# Patient Record
Sex: Female | Born: 1935 | Race: White | Hispanic: No | Smoking: Former smoker
Health system: Southern US, Community
[De-identification: ages and names within clinical notes are randomized; demographics above are authoritative.]

## PROBLEM LIST (undated history)

## (undated) DIAGNOSIS — L719 Rosacea, unspecified: Secondary | ICD-10-CM

## (undated) DIAGNOSIS — N390 Urinary tract infection, site not specified: Secondary | ICD-10-CM

## (undated) DIAGNOSIS — R7303 Prediabetes: Secondary | ICD-10-CM

## (undated) DIAGNOSIS — I10 Essential (primary) hypertension: Secondary | ICD-10-CM

## (undated) DIAGNOSIS — E785 Hyperlipidemia, unspecified: Secondary | ICD-10-CM

## (undated) DIAGNOSIS — E669 Obesity, unspecified: Secondary | ICD-10-CM

## (undated) DIAGNOSIS — H3530 Unspecified macular degeneration: Secondary | ICD-10-CM

## (undated) DIAGNOSIS — C4490 Unspecified malignant neoplasm of skin, unspecified: Secondary | ICD-10-CM

## (undated) HISTORY — DX: Unspecified macular degeneration: H35.30

## (undated) HISTORY — PX: ABDOMINAL HYSTERECTOMY: SHX81

## (undated) HISTORY — PX: FACIAL RECONSTRUCTION SURGERY: SHX631

## (undated) HISTORY — PX: FRACTURE SURGERY: SHX138

---

## 2004-12-14 ENCOUNTER — Ambulatory Visit: Payer: Self-pay | Admitting: Family Medicine

## 2008-12-17 ENCOUNTER — Ambulatory Visit: Payer: Self-pay | Admitting: Family Medicine

## 2014-04-14 DIAGNOSIS — C4491 Basal cell carcinoma of skin, unspecified: Secondary | ICD-10-CM

## 2014-04-14 HISTORY — DX: Basal cell carcinoma of skin, unspecified: C44.91

## 2015-09-09 ENCOUNTER — Emergency Department: Payer: Medicare Other

## 2015-09-09 ENCOUNTER — Observation Stay
Admit: 2015-09-09 | Discharge: 2015-09-09 | Disposition: A | Discharge status: Home / Self Care | Payer: Medicare Other | Attending: Internal Medicine | Admitting: Internal Medicine

## 2015-09-09 ENCOUNTER — Observation Stay
Admission: EM | Admit: 2015-09-09 | Discharge: 2015-09-10 | Disposition: A | Discharge status: Home / Self Care | Payer: Medicare Other | Attending: Internal Medicine | Admitting: Internal Medicine

## 2015-09-09 DIAGNOSIS — D72820 Lymphocytosis (symptomatic): Secondary | ICD-10-CM

## 2015-09-09 DIAGNOSIS — D72829 Elevated white blood cell count, unspecified: Secondary | ICD-10-CM

## 2015-09-09 DIAGNOSIS — Z8249 Family history of ischemic heart disease and other diseases of the circulatory system: Secondary | ICD-10-CM | POA: Diagnosis not present

## 2015-09-09 DIAGNOSIS — R778 Other specified abnormalities of plasma proteins: Secondary | ICD-10-CM

## 2015-09-09 DIAGNOSIS — R000 Tachycardia, unspecified: Secondary | ICD-10-CM | POA: Insufficient documentation

## 2015-09-09 DIAGNOSIS — R0781 Pleurodynia: Secondary | ICD-10-CM

## 2015-09-09 DIAGNOSIS — R748 Abnormal levels of other serum enzymes: Secondary | ICD-10-CM | POA: Diagnosis not present

## 2015-09-09 DIAGNOSIS — Z85828 Personal history of other malignant neoplasm of skin: Secondary | ICD-10-CM | POA: Insufficient documentation

## 2015-09-09 DIAGNOSIS — R7303 Prediabetes: Secondary | ICD-10-CM | POA: Diagnosis not present

## 2015-09-09 DIAGNOSIS — I10 Essential (primary) hypertension: Secondary | ICD-10-CM | POA: Diagnosis not present

## 2015-09-09 DIAGNOSIS — R072 Precordial pain: Secondary | ICD-10-CM | POA: Diagnosis present

## 2015-09-09 DIAGNOSIS — I200 Unstable angina: Principal | ICD-10-CM | POA: Insufficient documentation

## 2015-09-09 DIAGNOSIS — I440 Atrioventricular block, first degree: Secondary | ICD-10-CM | POA: Insufficient documentation

## 2015-09-09 DIAGNOSIS — R079 Chest pain, unspecified: Secondary | ICD-10-CM | POA: Insufficient documentation

## 2015-09-09 DIAGNOSIS — Z7982 Long term (current) use of aspirin: Secondary | ICD-10-CM | POA: Insufficient documentation

## 2015-09-09 DIAGNOSIS — E785 Hyperlipidemia, unspecified: Secondary | ICD-10-CM | POA: Diagnosis not present

## 2015-09-09 DIAGNOSIS — J329 Chronic sinusitis, unspecified: Secondary | ICD-10-CM | POA: Insufficient documentation

## 2015-09-09 DIAGNOSIS — Z6833 Body mass index (BMI) 33.0-33.9, adult: Secondary | ICD-10-CM | POA: Diagnosis not present

## 2015-09-09 DIAGNOSIS — R7989 Other specified abnormal findings of blood chemistry: Secondary | ICD-10-CM | POA: Diagnosis not present

## 2015-09-09 DIAGNOSIS — R071 Chest pain on breathing: Secondary | ICD-10-CM | POA: Diagnosis not present

## 2015-09-09 DIAGNOSIS — R61 Generalized hyperhidrosis: Secondary | ICD-10-CM | POA: Diagnosis not present

## 2015-09-09 DIAGNOSIS — N289 Disorder of kidney and ureter, unspecified: Secondary | ICD-10-CM | POA: Diagnosis not present

## 2015-09-09 DIAGNOSIS — Z79899 Other long term (current) drug therapy: Secondary | ICD-10-CM | POA: Diagnosis not present

## 2015-09-09 DIAGNOSIS — C9110 Chronic lymphocytic leukemia of B-cell type not having achieved remission: Secondary | ICD-10-CM

## 2015-09-09 HISTORY — DX: Prediabetes: R73.03

## 2015-09-09 HISTORY — DX: Rosacea, unspecified: L71.9

## 2015-09-09 HISTORY — DX: Essential (primary) hypertension: I10

## 2015-09-09 HISTORY — DX: Hyperlipidemia, unspecified: E78.5

## 2015-09-09 HISTORY — DX: Unspecified malignant neoplasm of skin, unspecified: C44.90

## 2015-09-09 HISTORY — DX: Obesity, unspecified: E66.9

## 2015-09-09 LAB — BASIC METABOLIC PANEL
ANION GAP: 9 (ref 5–15)
BUN: 22 mg/dL — ABNORMAL HIGH (ref 6–20)
CALCIUM: 9.3 mg/dL (ref 8.9–10.3)
CO2: 24 mmol/L (ref 22–32)
CREATININE: 1.14 mg/dL — AB (ref 0.44–1.00)
Chloride: 105 mmol/L (ref 101–111)
GFR calc Af Amer: 51 mL/min — ABNORMAL LOW (ref >60)
GFR calc non Af Amer: 44 mL/min — ABNORMAL LOW (ref >60)
Glucose, Bld: 127 mg/dL — ABNORMAL HIGH (ref 65–99)
Potassium: 3.9 mmol/L (ref 3.5–5.1)
SODIUM: 138 mmol/L (ref 135–145)

## 2015-09-09 LAB — CBC
HCT: 42.2 % (ref 35.0–47.0)
HEMOGLOBIN: 14 g/dL (ref 12.0–16.0)
MCH: 30.1 pg (ref 26.0–34.0)
MCHC: 33.2 g/dL (ref 32.0–36.0)
MCV: 90.6 fL (ref 80.0–100.0)
PLATELETS: 244 10*3/uL (ref 150–440)
RBC: 4.65 MIL/uL (ref 3.80–5.20)
RDW: 13.1 % (ref 11.5–14.5)
WBC: 14 10*3/uL — AB (ref 3.6–11.0)

## 2015-09-09 LAB — TROPONIN I
TROPONIN I: 0.04 ng/mL — AB (ref <0.031)
Troponin I: 0.04 ng/mL — ABNORMAL HIGH (ref <0.031)
Troponin I: 0.04 ng/mL — ABNORMAL HIGH (ref <0.031)

## 2015-09-09 MED ORDER — LOSARTAN POTASSIUM 50 MG PO TABS
100.0000 mg | ORAL_TABLET | Freq: Every day | ORAL | Status: DC
  Administered 2015-09-10: 100 mg via ORAL
  Filled 2015-09-09: qty 2

## 2015-09-09 MED ORDER — METOPROLOL TARTRATE 25 MG PO TABS
12.5000 mg | ORAL_TABLET | Freq: Two times a day (BID) | ORAL | Status: DC
  Administered 2015-09-09 (×2): 12.5 mg via ORAL
  Filled 2015-09-09 (×2): qty 1

## 2015-09-09 MED ORDER — ACETAMINOPHEN 325 MG PO TABS: 650.0000 mg | ORAL_TABLET | ORAL | Status: DC | PRN

## 2015-09-09 MED ORDER — ASPIRIN 81 MG PO CHEW
CHEWABLE_TABLET | ORAL | Status: AC
  Administered 2015-09-09: 324 mg via ORAL
  Filled 2015-09-09: qty 4

## 2015-09-09 MED ORDER — ONDANSETRON HCL 4 MG/2ML IJ SOLN: 4.0000 mg | Freq: Four times a day (QID) | INTRAMUSCULAR | Status: DC | PRN

## 2015-09-09 MED ORDER — SODIUM CHLORIDE 0.9 % IV BOLUS (SEPSIS)
1000.0000 mL | Freq: Once | INTRAVENOUS | Status: AC
  Administered 2015-09-09: 1000 mL via INTRAVENOUS

## 2015-09-09 MED ORDER — HYDROCHLOROTHIAZIDE 12.5 MG PO CAPS
12.5000 mg | ORAL_CAPSULE | Freq: Every day | ORAL | Status: DC
  Administered 2015-09-10: 12.5 mg via ORAL
  Filled 2015-09-09: qty 1

## 2015-09-09 MED ORDER — ASPIRIN 81 MG PO CHEW
324.0000 mg | CHEWABLE_TABLET | Freq: Once | ORAL | Status: AC
  Administered 2015-09-09: 324 mg via ORAL

## 2015-09-09 MED ORDER — MORPHINE SULFATE (PF) 2 MG/ML IV SOLN: 2.0000 mg | INTRAVENOUS | Status: DC | PRN

## 2015-09-09 MED ORDER — ATORVASTATIN CALCIUM 20 MG PO TABS: 20.0000 mg | ORAL_TABLET | Freq: Every day | ORAL | Status: DC

## 2015-09-09 MED ORDER — ZOLPIDEM TARTRATE 5 MG PO TABS
5.0000 mg | ORAL_TABLET | Freq: Every evening | ORAL | Status: DC | PRN
  Administered 2015-09-10: 5 mg via ORAL
  Filled 2015-09-09: qty 1

## 2015-09-09 MED ORDER — ENOXAPARIN SODIUM 40 MG/0.4ML ~~LOC~~ SOLN
40.0000 mg | SUBCUTANEOUS | Status: DC
  Administered 2015-09-09: 40 mg via SUBCUTANEOUS
  Filled 2015-09-09: qty 0.4

## 2015-09-09 MED ORDER — ASPIRIN EC 81 MG PO TBEC
81.0000 mg | DELAYED_RELEASE_TABLET | Freq: Every day | ORAL | Status: DC
  Administered 2015-09-10: 81 mg via ORAL
  Filled 2015-09-09: qty 1

## 2015-09-09 MED ORDER — ADULT MULTIVITAMIN W/MINERALS CH
1.0000 | ORAL_TABLET | Freq: Every day | ORAL | Status: DC
  Administered 2015-09-10: 1 via ORAL
  Filled 2015-09-09: qty 1

## 2015-09-09 MED ORDER — LOSARTAN POTASSIUM-HCTZ 100-12.5 MG PO TABS: 1.0000 | ORAL_TABLET | Freq: Every day | ORAL | Status: DC

## 2015-09-09 NOTE — H&P (Signed)
[facility] NAME: Shelly Phillips    MR#:  [id number]  DATE OF BIRTH:  1933-03-13  DATE OF ADMISSION:  09/09/2015  PRIMARY CARE PHYSICIAN: Dion Body, MD   REQUESTING/REFERRING PHYSICIAN: Joni Fears  CHIEF COMPLAINT:  Chest pain  HISTORY OF PRESENT ILLNESS:  Shelly Phillips  is a 82 y.o. female with a known history of essential hypertension and hyperlipidemia is presenting to the ED with a chief complaint of chest pain started at 10 AM when shopping at [employer]. Initially it started as chest tightness and subsequently patient started having chest pain. Patient sat down in [place] hoping that the tightness and pain will go away, but unfortunately it was not relieved and patient came into the ED. Initial troponin is negative. EKG with sinus tachycardia and nonspecific ST-T changes. Resting comfortably during my examination  PAST MEDICAL HISTORY:   Past Medical History  Diagnosis Date  . Rosacea   . Hypertension   . Skin cancer     PAST SURGICAL HISTOIRY:   Past Surgical History  Procedure Laterality Date  . Fracture surgery    . Facial reconstruction surgery      SOCIAL HISTORY:   Social History  Substance Use Topics  . Smoking status: Never Smoker   . Smokeless tobacco: Not on file  . Alcohol Use: No    FAMILY HISTORY:  Cardiac history-dad  DRUG ALLERGIES:   Allergies  Allergen Reactions  . Demerol [Meperidine] Nausea And Vomiting    REVIEW OF SYSTEMS:  CONSTITUTIONAL: No fever, fatigue or weakness.  EYES: No blurred or double vision.  EARS, NOSE, AND THROAT: No tinnitus or ear pain.  RESPIRATORY: No cough, shortness of breath, wheezing or hemoptysis. Has chronic sinusitis CARDIOVASCULAR: No chest pain, orthopnea, edema.  GASTROINTESTINAL: No nausea, vomiting, diarrhea or abdominal pain.  GENITOURINARY: No dysuria, hematuria.  ENDOCRINE: No polyuria, nocturia,  HEMATOLOGY: No anemia, easy  bruising or bleeding SKIN: No rash or lesion. MUSCULOSKELETAL: No joint pain or arthritis.   NEUROLOGIC: No tingling, numbness, weakness.  PSYCHIATRY: No anxiety or depression.   MEDICATIONS AT HOME:   Prior to Admission medications   Medication Sig Start Date End Date Taking? Authorizing Provider  aspirin EC 81 MG tablet Take 1 tablet by mouth daily. 02/27/15  Yes Historical Provider, MD  atorvastatin (LIPITOR) 20 MG tablet Take 20 mg by mouth daily at 6 PM.   Yes Historical Provider, MD  losartan-hydrochlorothiazide (HYZAAR) 100-12.5 MG tablet Take 1 tablet by mouth daily.   Yes Historical Provider, MD  Multiple Vitamin (MULTI-VITAMINS) TABS Take 1 tablet by mouth daily.   Yes Historical Provider, MD      VITAL SIGNS:  Blood pressure 133/80, pulse 113, temperature 98.2 F (36.8 C), temperature source Oral, resp. rate 18, height 5\' 8"  (1.727 m), weight 99.791 kg (220 lb), SpO2 98 %.  PHYSICAL EXAMINATION:  GENERAL:  82 y.o.-year-old patient lying in the bed with no acute distress.  EYES: Pupils equal, round, reactive to light and accommodation. No scleral icterus. Extraocular muscles intact.  HEENT: Head atraumatic, normocephalic. Oropharynx and nasopharynx clear.  NECK:  Supple, no jugular venous distention. No thyroid enlargement, no tenderness.  LUNGS: Normal breath sounds bilaterally, no wheezing, rales,rhonchi or crepitation. No use of accessory muscles of respiration.  CARDIOVASCULAR: S1, S2 normal. No murmurs, rubs, or gallops. No reproducible anterior chest wall tenderness ABDOMEN: Soft, nontender, nondistended. Bowel sounds present. No organomegaly or mass.  EXTREMITIES: No pedal edema, cyanosis, or  clubbing.  NEUROLOGIC: Cranial nerves II through XII are intact. Muscle strength 5/5 in all extremities. Sensation intact. Gait not checked.  PSYCHIATRIC: The patient is alert and oriented x 3.  SKIN: No obvious rash, lesion, or ulcer.   LABORATORY PANEL:   CBC  Recent  Labs Lab 09/09/15 1237  WBC 14.0*  HGB 14.0  HCT 42.2  PLT 244   ------------------------------------------------------------------------------------------------------------------  Chemistries   Recent Labs Lab 09/09/15 1237  NA 138  K 3.9  CL 105  CO2 24  GLUCOSE 127*  BUN 22*  CREATININE 1.14*  CALCIUM 9.3   ------------------------------------------------------------------------------------------------------------------  Cardiac Enzymes  Recent Labs Lab 09/09/15 1237  TROPONINI 0.04*   ------------------------------------------------------------------------------------------------------------------  RADIOLOGY:  Dg Chest 2 View  09/09/2015  CLINICAL DATA:  Chest pain and tightness for several hours, initial encounter EXAM: CHEST  2 VIEW COMPARISON:  None. FINDINGS: The heart size and mediastinal contours are within normal limits. Both lungs are clear. The visualized skeletal structures are unremarkable. IMPRESSION: No active cardiopulmonary disease. Electronically Signed   By: Inez Catalina M.D.   On: 09/09/2015 13:43    EKG:   Orders placed or performed during the hospital encounter of 09/09/15  . EKG 12-Lead  . EKG 12-Lead  . ED EKG within 10 minutes  . ED EKG within 10 minutes    IMPRESSION AND PLAN:   82 year old female with past medical history of essential hypertension and hyperlipidemia is presenting to the ED with a chief complaint of chest pain from 10 AM radiating to the left shoulder and left hand. Initial troponin is negative  #Chest pain rule out acute MI At high risk given her age, essential hypertension, hyperlipidemia and family history of cardiac disease Admit to telemetry and cycle cardiac biomarkers to rule out acute MI Ordered cardiac stress test in a.m. if troponins are negative Up to an echocardiogram Provide oxygen via nasal cannula as needed to maintain her pulse ox greater than 93% Continue home medication aspirin, statin and  Cozaar Small beta blocker to the regimen Check fasting lipid panel in a.m.  #Essential hypertension Resume her home medication Cozaar Small dose beta blocker is added as tolerated  #Hyperlipidemia unspecified Continue statin and check fasting lipid panel in a.m.  #Chronic sinusitis Currently no acute exacerbation, monitor clinically  DVT prophylaxis with Lovenox subcutaneous   All the records are reviewed and case discussed with ED provider. Management plans discussed with the patient, and she is in agreement  CODE STATUS: fc, friend Georgiann Mohs is HCPOA  TOTAL TIME TAKING CARE OF THIS PATIENT: 45  minutes.    Nicholes Mango M.D on 09/09/2015 at 3:18 PM  Between 7am to 6pm - Pager - [phone]  After 6pm go to [email or web address] - password [email or web address] [facility]  Office  [phone]  CC: Primary care physician; Dion Body, MD

## 2015-09-09 NOTE — ED Notes (Signed)
Pt c/o intermittent chest pain today while out shopping,, states she had SOB and diaphoresis with it.. Denies any pain at present.. Pt is in NAD on arrival

## 2015-09-09 NOTE — Progress Notes (Signed)
Pt admitted to 2A [id number] from ED. A&O, VSS, no complaints of pain, SR with a 1st degree HB on tele. Tele verified with Candace, NT and skin verified with Junious Dresser, RN. Admission completed, pt oriented to room and pt resting comfortably in room.

## 2015-09-09 NOTE — ED Provider Notes (Signed)
[facility] Emergency Department Provider Note  ____________________________________________  Time seen: 2:00 PM  I have reviewed the triage vital signs and the nursing notes.   HISTORY  Chief Complaint Chest Pain    HPI Shelly Phillips is a 82 y.o. female who complains of intermittent chest pain today.  The pain started at 10:00 AM while she was shopping. It was rapid in onset, anterior chest, tightness radiating to the shoulder blades, associated with shortness of breath and diaphoresis. Not exertional, seemed to improve with rest. He had a total of 2 episodes each lasting 30-45 minutes at a time. No other aggravating or alleviating factors. Has not had pain like this before. On arrival to the ED, the pain has resolved and is now 0 out of 10.  History of hypertension and hyperlipidemia.     Past Medical History  Diagnosis Date  . Rosacea   . Hypertension   . Skin cancer      There are no active problems to display for this patient.    Past Surgical History  Procedure Laterality Date  . Fracture surgery    . Facial reconstruction surgery       No current outpatient prescriptions on file.   Allergies Demerol   No family history on file.  Social History Social History  Substance Use Topics  . Smoking status: Never Smoker   . Smokeless tobacco: None  . Alcohol Use: No    Review of Systems  Constitutional:   No fever or chills. No weight changes Eyes:   No blurry vision or double vision.  ENT:   No sore throat.  Cardiovascular:   Positive as above chest pain. Respiratory:   Shortness of breath with the chest pain Gastrointestinal:   Negative for abdominal pain, vomiting and diarrhea.  No BRBPR or melena. Genitourinary:   Negative for dysuria or difficulty urinating. Musculoskeletal:   Negative for back pain. No joint swelling or pain. Skin:   Negative for rash. Neurological:   Negative for headaches, focal weakness or  numbness. Psychiatric:  No anxiety or depression.   Endocrine:  No changes in energy or sleep difficulty.  10-point ROS otherwise negative.  ____________________________________________   PHYSICAL EXAM:  VITAL SIGNS: ED Triage Vitals  Enc Vitals Group     BP 09/09/15 1233 133/80 mmHg     Pulse Rate 09/09/15 1233 113     Resp 09/09/15 1233 18     Temp 09/09/15 1233 98.2 F (36.8 C)     Temp Source 09/09/15 1233 Oral     SpO2 09/09/15 1233 98 %     Weight 09/09/15 1233 220 lb (99.791 kg)     Height 09/09/15 1233 5\' 8"  (1.727 m)     Head Cir --      Peak Flow --      Pain Score --      Pain Loc --      Pain Edu? --      Excl. in [place]? --     Vital signs reviewed, nursing assessments reviewed.   Constitutional:   Alert and oriented. Well appearing and in no distress. Eyes:   No scleral icterus. No conjunctival pallor. PERRL. EOMI ENT   Head:   Normocephalic and atraumatic.   Nose:   No congestion/rhinnorhea. No septal hematoma   Mouth/Throat:   MMM, no pharyngeal erythema. No peritonsillar mass.    Neck:   No stridor. No SubQ emphysema. No meningismus. Hematological/Lymphatic/Immunilogical:   No cervical  lymphadenopathy. Cardiovascular:   Tachycardia heart rate 110. Symmetric bilateral radial and DP pulses.  No murmurs.  Respiratory:   Normal respiratory effort without tachypnea nor retractions. Breath sounds are clear and equal bilaterally. No wheezes/rales/rhonchi. Gastrointestinal:   Soft and nontender. Non distended. There is no CVA tenderness.  No rebound, rigidity, or guarding. Genitourinary:   deferred Musculoskeletal:   Nontender with normal range of motion in all extremities. No joint effusions.  No lower extremity tenderness.  No edema. Chest wall nontender Neurologic:   Normal speech and language.  CN 2-10 normal. Motor grossly intact. No gross focal neurologic deficits are appreciated.  Skin:    Skin is warm, dry and intact. No rash noted.  No  petechiae, purpura, or bullae. Psychiatric:   Mood and affect are normal. ____________________________________________    LABS (pertinent positives/negatives) (all labs ordered are listed, but only abnormal results are displayed) Labs Reviewed  BASIC METABOLIC PANEL - Abnormal; Notable for the following:    Glucose, Bld 127 (*)    BUN 22 (*)    Creatinine, Ser 1.14 (*)    GFR calc non Af Amer 44 (*)    GFR calc Af Amer 51 (*)    All other components within normal limits  CBC - Abnormal; Notable for the following:    WBC 14.0 (*)    All other components within normal limits  TROPONIN I - Abnormal; Notable for the following:    Troponin I 0.04 (*)    All other components within normal limits   ____________________________________________   EKG  Interpreted by me Sinus tachycardia rate 114, left axis, normal intervals. Poor R-wave progression in anterior precordial leads. Normal ST segments and T waves.  ____________________________________________    RADIOLOGY  Chest x-ray unremarkable  ____________________________________________   PROCEDURES   ____________________________________________   INITIAL IMPRESSION / ASSESSMENT AND PLAN / ED COURSE  Pertinent labs & imaging results that were available during my care of the patient were reviewed by me and considered in my medical decision making (see chart for details).  Patient presents with chest pain concerning for myocardial ischemia. She has no pulse deficits, not significantly hypertensive, and pain has been intermittent and now resolved, at low suspicion for dissection or aortic aneurysm. Low suspicion for PE. No EKG changes, chest pain resolved at present time so we'll give aspirin and hold off on anticoagulation, but I think the patient does warrant inpatient monitoring for serial troponins and cardiology evaluation.  IV fluids for tachycardia. We'll discuss with  hospitalist     ____________________________________________   FINAL CLINICAL IMPRESSION(S) / ED DIAGNOSES  Final diagnoses:  Tachycardia  Precordial pain      Carrie Mew, MD 09/09/15 1421

## 2015-09-09 NOTE — ED Notes (Signed)
MD at bedside. 

## 2015-09-10 ENCOUNTER — Encounter: Payer: Self-pay | Admitting: Physician Assistant

## 2015-09-10 ENCOUNTER — Observation Stay (HOSPITAL_BASED_OUTPATIENT_CLINIC_OR_DEPARTMENT_OTHER): Payer: Medicare Other

## 2015-09-10 DIAGNOSIS — R7989 Other specified abnormal findings of blood chemistry: Secondary | ICD-10-CM

## 2015-09-10 DIAGNOSIS — R079 Chest pain, unspecified: Secondary | ICD-10-CM

## 2015-09-10 DIAGNOSIS — C9110 Chronic lymphocytic leukemia of B-cell type not having achieved remission: Secondary | ICD-10-CM

## 2015-09-10 DIAGNOSIS — D72829 Elevated white blood cell count, unspecified: Secondary | ICD-10-CM

## 2015-09-10 DIAGNOSIS — I200 Unstable angina: Secondary | ICD-10-CM

## 2015-09-10 DIAGNOSIS — N289 Disorder of kidney and ureter, unspecified: Secondary | ICD-10-CM

## 2015-09-10 DIAGNOSIS — R7303 Prediabetes: Secondary | ICD-10-CM

## 2015-09-10 DIAGNOSIS — I10 Essential (primary) hypertension: Secondary | ICD-10-CM

## 2015-09-10 DIAGNOSIS — R0781 Pleurodynia: Secondary | ICD-10-CM

## 2015-09-10 DIAGNOSIS — D72820 Lymphocytosis (symptomatic): Secondary | ICD-10-CM

## 2015-09-10 DIAGNOSIS — E785 Hyperlipidemia, unspecified: Secondary | ICD-10-CM

## 2015-09-10 DIAGNOSIS — R778 Other specified abnormalities of plasma proteins: Secondary | ICD-10-CM

## 2015-09-10 LAB — NM MYOCAR MULTI W/SPECT W/WALL MOTION / EF
CHL CUP NUCLEAR SDS: 1
CHL CUP NUCLEAR SSS: 1
CHL CUP STRESS STAGE 1 HR: 78 {beats}/min
CHL CUP STRESS STAGE 2 GRADE: 0 %
CHL CUP STRESS STAGE 2 HR: 81 {beats}/min
CHL CUP STRESS STAGE 2 SPEED: 0 mph
CHL CUP STRESS STAGE 4 SPEED: 0 mph
CHL CUP STRESS STAGE 5 DBP: 64 mmHg
CHL CUP STRESS STAGE 5 GRADE: 0 %
CHL CUP STRESS STAGE 5 HR: 93 {beats}/min
CHL CUP STRESS STAGE 5 SBP: 106 mmHg
CHL CUP STRESS STAGE 5 SPEED: 0 mph
CSEPPMHR: 68 %
Estimated workload: 1 METS
LV dias vol: 48 mL
LVSYSVOL: 12 mL
Peak HR: 96 {beats}/min
Percent HR: 70 %
Rest HR: 80 {beats}/min
SRS: 8
Stage 3 Grade: 0 %
Stage 3 HR: 81 {beats}/min
Stage 3 Speed: 0 mph
Stage 4 Grade: 0 %
Stage 4 HR: 96 {beats}/min
TID: 1

## 2015-09-10 LAB — LIPID PANEL
CHOLESTEROL: 133 mg/dL (ref 0–200)
HDL: 48 mg/dL (ref >40)
LDL CALC: 48 mg/dL (ref 0–99)
TRIGLYCERIDES: 185 mg/dL — AB (ref <150)
Total CHOL/HDL Ratio: 2.8 RATIO
VLDL: 37 mg/dL (ref 0–40)

## 2015-09-10 LAB — TROPONIN I

## 2015-09-10 MED ORDER — SODIUM CHLORIDE 0.9 % IV SOLN
INTRAVENOUS | Status: DC
  Administered 2015-09-10: 16:00:00 via INTRAVENOUS

## 2015-09-10 MED ORDER — METOPROLOL TARTRATE 25 MG PO TABS: 12.5000 mg | ORAL_TABLET | Freq: Two times a day (BID) | ORAL | Status: DC

## 2015-09-10 MED ORDER — REGADENOSON 0.4 MG/5ML IV SOLN
0.4000 mg | Freq: Once | INTRAVENOUS | Status: AC
  Administered 2015-09-10: 0.4 mg via INTRAVENOUS

## 2015-09-10 MED ORDER — TECHNETIUM TC 99M SESTAMIBI - CARDIOLITE
30.4300 | Freq: Once | INTRAVENOUS | Status: AC | PRN
  Administered 2015-09-10: 30.43 via INTRAVENOUS

## 2015-09-10 MED ORDER — TECHNETIUM TC 99M SESTAMIBI - CARDIOLITE
11.9300 | Freq: Once | INTRAVENOUS | Status: AC | PRN
  Administered 2015-09-10: 11:00:00 11.93 via INTRAVENOUS

## 2015-09-10 NOTE — Progress Notes (Signed)
Discharge instructions along with home medication list gone over with patient. Patient verbalized that she understood instructions. Patient made aware that rx was electronically submitted to pharmacy. Iv and telemetry removed. Patient to be discharged home on RA. No distress noted, friend at bedside to transport patient home

## 2015-09-10 NOTE — Progress Notes (Signed)
Patient refusing to be stuck again for ct angio, patient needs a 20 gauge. Per was made aware iv is needed to have the ct angio done. Patient stated " i dont want to do the test, i want to go home. Call the dr and have her cancel the test" called and spoke with dr. Ether Griffins to make aware patient is refusing to have test done. Per md will place orders to discharge patient. Will continue to monitor

## 2015-09-10 NOTE — Consult Note (Signed)
Cardiology Consultation Note  Patient ID: Shelly Phillips, MRN: [id number], DOB/AGE: 01/21/1933 82 y.o. Admit date: 09/09/2015   Date of Consult: 09/10/2015 Primary Physician: Dion Body, MD Primary Cardiologist: New to [facility]  Chief Complaint: Chest pain Reason for Consult: Chest pain  HPI: 82 y.o. female with h/o HTN, HLD, pre-diabetes, and obesity who presented to [facility] on 2/22 with chest pain last 2 minutes while walking through [employer].   She has no previously known cardiac history and has never seen a cardiologist or had a stress test or cardiac cath. SHe is fairly active at baseline and able to take care of all her ADL's without issues. She was never a smoker, denies using alcohol or illegal drugs. She has been in her usual state of health and asked her friend to drive her to [place] on 2/22 to pick up some supples. While she was there she suddenly got very tired and felt like she needed to sit down so she went to the shoe department. Upon sitting down she developed chest pain that lasted 2 minutes before self resolving. She was wheeled out to the car in a wheelchair. While being wheeled out she had a couple episodes of fleeting chest pain that lasted seconds and some left arm numbness. She denies any diaphoresis, nausea, vomiting, palpitations, presyncope, syncope, or dizziness. She has never had chest pain before. Since her admission she has not had any further chest pain.   Upon the patient's arrival to [facility] they were found to have troponin 0.04 x 3-->0.03, WBC 14.0, SCr 1.14, BUN 22, LDL 48, trigs 185. ECG showed sinus tachycardia, 114 bpm, inferior and anteroseptal infarct, follow up EKG on 2/23 showed NSR, 78 bpm, 1st degree AV block. CXR showed no active disease. She has been scheduled for nuclear stress test by IM. She is currently chest pain free.   Past Medical History  Diagnosis Date  . Rosacea   . Hypertension   . Skin cancer   . HLD (hyperlipidemia)   . Obesity   .  Pre-diabetes       Most Recent Cardiac Studies: none   Surgical History:  Past Surgical History  Procedure Laterality Date  . Fracture surgery    . Facial reconstruction surgery       Home Meds: Prior to Admission medications   Medication Sig Start Date End Date Taking? Authorizing Provider  aspirin EC 81 MG tablet Take 1 tablet by mouth daily. 02/27/15  Yes Historical Provider, MD  atorvastatin (LIPITOR) 20 MG tablet Take 20 mg by mouth daily at 6 PM.   Yes Historical Provider, MD  losartan-hydrochlorothiazide (HYZAAR) 100-12.5 MG tablet Take 1 tablet by mouth daily.   Yes Historical Provider, MD  Multiple Vitamin (MULTI-VITAMINS) TABS Take 1 tablet by mouth daily.   Yes Historical Provider, MD    Inpatient Medications:  . aspirin EC  81 mg Oral Daily  . atorvastatin  20 mg Oral q1800  . enoxaparin (LOVENOX) injection  40 mg Subcutaneous Q24H  . losartan  100 mg Oral Daily   And  . hydrochlorothiazide  12.5 mg Oral Daily  . metoprolol tartrate  12.5 mg Oral BID  . multivitamin with minerals  1 tablet Oral Daily      Allergies:  Allergies  Allergen Reactions  . Demerol [Meperidine] Nausea And Vomiting    Social History   Social History  . Marital Status: Widowed    Spouse Name: N/A  . Number of Children: N/A  . Years  of Education: N/A   Occupational History  . Not on file.   Social History Main Topics  . Smoking status: Never Smoker   . Smokeless tobacco: Not on file  . Alcohol Use: No  . Drug Use: Not on file  . Sexual Activity: Not on file   Other Topics Concern  . Not on file   Social History Narrative     Family History  Problem Relation Age of Onset  . CAD Father   . Cancer Brother   . Parkinson's disease Mother      Review of Systems: Review of Systems  Constitutional: Positive for malaise/fatigue. Negative for fever, chills, weight loss and diaphoresis.  HENT: Negative for congestion.   Eyes: Negative for discharge and redness.    Respiratory: Negative for cough, hemoptysis, sputum production, shortness of breath and wheezing.   Cardiovascular: Positive for chest pain. Negative for palpitations, orthopnea, claudication, leg swelling and PND.  Gastrointestinal: Negative for heartburn, nausea, vomiting, abdominal pain, blood in stool and melena.  Musculoskeletal: Negative for myalgias and falls.  Skin: Negative for rash.  Neurological: Positive for weakness. Negative for dizziness, tingling, tremors, sensory change, speech change, focal weakness and loss of consciousness.  Endo/Heme/Allergies: Does not bruise/bleed easily.  Psychiatric/Behavioral: Negative for substance abuse. The patient is not nervous/anxious.   All other systems reviewed and are negative.    Labs:  Recent Labs  09/09/15 1237 09/09/15 1822 09/09/15 2206 09/10/15 0119  TROPONINI 0.04* 0.04* 0.04* <0.03   Lab Results  Component Value Date   WBC 14.0* 09/09/2015   HGB 14.0 09/09/2015   HCT 42.2 09/09/2015   MCV 90.6 09/09/2015   PLT 244 09/09/2015     Recent Labs Lab 09/09/15 1237  NA 138  K 3.9  CL 105  CO2 24  BUN 22*  CREATININE 1.14*  CALCIUM 9.3  GLUCOSE 127*   Lab Results  Component Value Date   CHOL 133 09/10/2015   HDL 48 09/10/2015   LDLCALC 48 09/10/2015   TRIG 185* 09/10/2015   No results found for: DDIMER  Radiology/Studies:  Dg Chest 2 View  09/09/2015  CLINICAL DATA:  Chest pain and tightness for several hours, initial encounter EXAM: CHEST  2 VIEW COMPARISON:  None. FINDINGS: The heart size and mediastinal contours are within normal limits. Both lungs are clear. The visualized skeletal structures are unremarkable. IMPRESSION: No active cardiopulmonary disease. Electronically Signed   By: Inez Catalina M.D.   On: 09/09/2015 13:43    EKG: NSR, 78 bpm, 1st degree AV block, no acute st/t changes  Weights: Filed Weights   09/09/15 1233  Weight: 220 lb (99.791 kg)     Physical Exam: Blood pressure  116/71, pulse 78, temperature 98.1 F (36.7 C), temperature source Oral, resp. rate 16, height 5\' 8"  (1.727 m), weight 220 lb (99.791 kg), SpO2 96 %. Body mass index is 33.46 kg/(m^2). General: Well developed, well nourished, in no acute distress. Head: Normocephalic, atraumatic, sclera non-icteric, no xanthomas, nares are without discharge.  Neck: Negative for carotid bruits. JVD not elevated. Lungs: Clear bilaterally to auscultation without wheezes, rales, or rhonchi. Breathing is unlabored. Heart: RRR with S1 S2. No murmurs, rubs, or gallops appreciated. Abdomen: Soft, non-tender, non-distended with normoactive bowel sounds. No hepatomegaly. No rebound/guarding. No obvious abdominal masses. Msk:  Strength and tone appear normal for age. Extremities: No clubbing or cyanosis. No edema.  Distal pedal pulses are 2+ and equal bilaterally. Neuro: Alert and oriented X 3. No facial asymmetry.  No focal deficit. Moves all extremities spontaneously. Psych:  Responds to questions appropriately with a normal affect.    Assessment and Plan:   1. Chest pain with moderate risk for CAD: -Patient had an episode of chest pain lasting 2 minutes in [employer] with associated generalized fatigue  -She has been chest pain free since -Nuclear stress test and echo have been ordered by internal medicine -Further recommendations pending results of the stress test and echo -Continue aspirin 81 mg, Lopressor 12.5 mg bid, Lipitor, and losartan  2. HTN: -Well controlled continue current medications  3. HLD: -Continue Lipitor  4. Pre-diabetes: -Per IM  Signed, Christell Faith, PA-C Pager: [phone] 09/10/2015, 10:19 AM

## 2015-09-10 NOTE — Progress Notes (Signed)
Spoke with dr. Ether Griffins to make aware the stress test has resulted. Per md she will come to the floor to assess patient since she was gone this am.

## 2015-09-10 NOTE — Discharge Summary (Signed)
[facility] NAME: Shelly Phillips    MR#:  [id number]  DATE OF BIRTH:  15-Feb-1933  DATE OF ADMISSION:  09/09/2015 ADMITTING PHYSICIAN: Nicholes Mango, MD  DATE OF DISCHARGE: No discharge date for patient encounter.  PRIMARY CARE PHYSICIAN: Dion Body, MD     ADMISSION DIAGNOSIS:  Precordial pain [R07.2] Tachycardia [R00.0]  DISCHARGE DIAGNOSIS:  Principal Problem:   Unstable angina (HCC) Active Problems:   Hyperlipidemia   Pleuritic chest pain   Elevated troponin   Essential hypertension   Morbid obesity (HCC)   Pre-diabetes   Renal insufficiency   Leukocytosis   SECONDARY DIAGNOSIS:   Past Medical History  Diagnosis Date  . Rosacea   . Hypertension   . Skin cancer   . HLD (hyperlipidemia)   . Obesity   . Pre-diabetes     .pro HOSPITAL COURSE:   Patient is 82 year old Caucasian female with past medical history is negative and history of essential hypertension, hyperlipidemia, obesity, prediabetes, who presents to the hospital with complaints of chest pain, pain lasted approximately 2 minutes associated with weakness and diaphoresis. Initial troponin  revealed mild elevation to 0.04 3. Patient's labs showed mild elevation of creatinine to 1.14 and elevation to 12. This will contact 14.0.  Chest x-ray was normal . Patient was admitted to the hospital for further evaluation and underwent stress test read by Dr. Fletcher Anon and deemed to be low risk study . While in the hospital patient was also complaining of some pleuritic chest pain . CTA of chest was ordered, however, patient refused and wanted to go home . Risks were discussed this patient. However, she flatly requested to be discharged  Discussion by problem  #1. Chest pain, likely noncardiac. Since patient's IV was unremarkable. Echocardiogram is still pending at the time of dictation. Since patient's chest pain change character and was more pleuritic chest  pain. CTA of chest was recommended. However, patient's family refused despite discussions about the risks #2. Essential hypertension, continue outpatient medications. Patient's blood pressure is relatively well controlled. #3. Hyperlipidemia. Patient's lipid panel revealed LDL of 48, triglycerides 185, patient was advised to continue low-fat, low-cholesterol diet and Lipitor. #4. Renal insufficiency, it is recommended to follow kidney function as outpatient. #5. Leukocytosis, patient has no fevers to exhibit bacterial infection, recheck as outpatient  DISCHARGE CONDITIONS:   Stable  CONSULTS OBTAINED:  Treatment Team:  Nicholes Mango, MD Minna Merritts, MD  DRUG ALLERGIES:   Allergies  Allergen Reactions  . Demerol [Meperidine] Nausea And Vomiting    DISCHARGE MEDICATIONS:   Current Discharge Medication List    START taking these medications   Details  metoprolol tartrate (LOPRESSOR) 25 MG tablet Take 0.5 tablets (12.5 mg total) by mouth 2 (two) times daily. Qty: 60 tablet, Refills: 5      CONTINUE these medications which have NOT CHANGED   Details  aspirin EC 81 MG tablet Take 1 tablet by mouth daily.    atorvastatin (LIPITOR) 20 MG tablet Take 20 mg by mouth daily at 6 PM.    losartan-hydrochlorothiazide (HYZAAR) 100-12.5 MG tablet Take 1 tablet by mouth daily.    Multiple Vitamin (MULTI-VITAMINS) TABS Take 1 tablet by mouth daily.         DISCHARGE INSTRUCTIONS:    Patient is to follow-up with primary care physician and cardiologist as outpatient  If you experience worsening of your admission symptoms, develop shortness of breath, life threatening emergency, suicidal or homicidal thoughts  you must seek medical attention immediately by calling 911 or calling your MD immediately  if symptoms less severe.  You Must read complete instructions/literature along with all the possible adverse reactions/side effects for all the Medicines you take and that have been  prescribed to you. Take any new Medicines after you have completely understood and accept all the possible adverse reactions/side effects.   Please note  You were cared for by a hospitalist during your hospital stay. If you have any questions about your discharge medications or the care you received while you were in the hospital after you are discharged, you can call the unit and asked to speak with the hospitalist on call if the hospitalist that took care of you is not available. Once you are discharged, your primary care physician will handle any further medical issues. Please note that NO REFILLS for any discharge medications will be authorized once you are discharged, as it is imperative that you return to your primary care physician (or establish a relationship with a primary care physician if you do not have one) for your aftercare needs so that they can reassess your need for medications and monitor your lab values.    Today   CHIEF COMPLAINT:   Chief Complaint  Patient presents with  . Chest Pain    HISTORY OF PRESENT ILLNESS:  Shelly Phillips  is a 82 y.o. female with a known history of essential hypertension, hyperlipidemia, obesity, prediabetes, who presents to the hospital with complaints of chest pain, pain lasted approximately 2 minutes associated with weakness and diaphoresis. Initial troponin  revealed mild elevation to 0.04 3. Patient's labs showed mild elevation of creatinine to 1.14 and elevation to 12. This will contact 14.0.  Chest x-ray was normal . Patient was admitted to the hospital for further evaluation and underwent stress test read by Dr. Fletcher Anon and deemed to be low risk study . While in the hospital patient was also complaining of some pleuritic chest pain . CTA of chest was ordered, however, patient refused and wanted to go home . Risks were discussed this patient. However, she flatly requested to be discharged  Discussion by problem  #1. Chest pain, likely  noncardiac. Since patient's IV was unremarkable. Echocardiogram is still pending at the time of dictation. Since patient's chest pain change character and was more pleuritic chest pain. CTA of chest was recommended. However, patient's family refused despite discussions about the risks #2. Essential hypertension, continue outpatient medications. Patient's blood pressure is relatively well controlled. #3. Hyperlipidemia. Patient's lipid panel revealed LDL of 48, triglycerides 185, patient was advised to continue low-fat, low-cholesterol diet and Lipitor. #4. Renal insufficiency, it is recommended to follow kidney function as outpatient. #5. Leukocytosis, patient has no fevers to exhibit bacterial infection, recheck as outpatient     VITAL SIGNS:  Blood pressure 126/66, pulse 75, temperature 97.8 F (36.6 C), temperature source Oral, resp. rate 17, height 5\' 8"  (1.727 m), weight 99.791 kg (220 lb), SpO2 93 %.  I/O:   Intake/Output Summary (Last 24 hours) at 09/10/15 1632 Last data filed at 09/10/15 1415  Gross per 24 hour  Intake      0 ml  Output   1600 ml  Net  -1600 ml    PHYSICAL EXAMINATION:  GENERAL:  82 y.o.-year-old patient lying in the bed with no acute distress.  EYES: Pupils equal, round, reactive to light and accommodation. No scleral icterus. Extraocular muscles intact.  HEENT: Head atraumatic, normocephalic. Oropharynx and nasopharynx clear.  NECK:  Supple, no jugular venous distention. No thyroid enlargement, no tenderness.  LUNGS: Normal breath sounds bilaterally, no wheezing, rales,rhonchi or crepitation. No use of accessory muscles of respiration.  CARDIOVASCULAR: S1, S2 normal. No murmurs, rubs, or gallops.  ABDOMEN: Soft, non-tender, non-distended. Bowel sounds present. No organomegaly or mass.  EXTREMITIES: No pedal edema, cyanosis, or clubbing.  NEUROLOGIC: Cranial nerves II through XII are intact. Muscle strength 5/5 in all extremities. Sensation intact. Gait not  checked.  PSYCHIATRIC: The patient is alert and oriented x 3.  SKIN: No obvious rash, lesion, or ulcer.   DATA REVIEW:   CBC  Recent Labs Lab 09/09/15 1237  WBC 14.0*  HGB 14.0  HCT 42.2  PLT 244    Chemistries   Recent Labs Lab 09/09/15 1237  NA 138  K 3.9  CL 105  CO2 24  GLUCOSE 127*  BUN 22*  CREATININE 1.14*  CALCIUM 9.3    Cardiac Enzymes  Recent Labs Lab 09/10/15 0119  TROPONINI <0.03    Microbiology Results  No results found for this or any previous visit.  RADIOLOGY:  Dg Chest 2 View  09/09/2015  CLINICAL DATA:  Chest pain and tightness for several hours, initial encounter EXAM: CHEST  2 VIEW COMPARISON:  None. FINDINGS: The heart size and mediastinal contours are within normal limits. Both lungs are clear. The visualized skeletal structures are unremarkable. IMPRESSION: No active cardiopulmonary disease. Electronically Signed   By: Inez Catalina M.D.   On: 09/09/2015 13:43   Nm Myocar Multi W/spect W/wall Motion / Ef  09/10/2015   There was no ST segment deviation noted during stress.  No T wave inversion was noted during stress.  The study is normal.  This is a low risk study.  The left ventricular ejection fraction is normal (55-65%).     EKG:   Orders placed or performed during the hospital encounter of 09/09/15  . EKG 12-Lead  . EKG 12-Lead  . ED EKG within 10 minutes  . ED EKG within 10 minutes  . EKG 12-Lead (at 6am)  . EKG 12-Lead (at 6am)      Management plans discussed with the patient, family and they are in agreement.  CODE STATUS:     Code Status Orders        Start     Ordered   09/09/15 1808  Full code   Continuous     09/09/15 1807    Code Status History    Date Active Date Inactive Code Status Order ID Comments User Context   This patient has a current code status but no historical code status.      TOTAL TIME TAKING CARE OF THIS PATIENT: 40  minutes.    Theodoro Grist M.D on 09/10/2015 at 4:32  PM  Between 7am to 6pm - Pager - [phone]  After 6pm go to [email or web address] - password [email or web address] [facility]  Office  [phone]  CC: Primary care physician; Dion Body, MD

## 2015-09-11 ENCOUNTER — Telehealth: Payer: Self-pay

## 2015-09-11 NOTE — Telephone Encounter (Signed)
Patient contacted regarding discharge from [facility] on Feb 23.  Patient understands to follow up with provider Fletcher Anon on Feb 27 at [facility]. Patient understands discharge instructions? yes Patient understands medications and regiment? yes Patient understands to bring all medications to this visit? yes

## 2015-09-14 ENCOUNTER — Other Ambulatory Visit
Admission: RE | Admit: 2015-09-14 | Discharge: 2015-09-14 | Disposition: A | Discharge status: Home / Self Care | Payer: Medicare Other | Source: Ambulatory Visit | Attending: Cardiovascular Disease | Admitting: Cardiovascular Disease

## 2015-09-14 ENCOUNTER — Ambulatory Visit (INDEPENDENT_AMBULATORY_CARE_PROVIDER_SITE_OTHER): Payer: Medicare Other | Admitting: Cardiovascular Disease

## 2015-09-14 ENCOUNTER — Encounter: Payer: Self-pay | Admitting: Cardiovascular Disease

## 2015-09-14 ENCOUNTER — Other Ambulatory Visit: Payer: Self-pay

## 2015-09-14 VITALS — BP 110/80 | HR 80 | Ht 68.5 in | Wt 219.5 lb

## 2015-09-14 DIAGNOSIS — R079 Chest pain, unspecified: Secondary | ICD-10-CM

## 2015-09-14 DIAGNOSIS — I10 Essential (primary) hypertension: Secondary | ICD-10-CM

## 2015-09-14 DIAGNOSIS — R7989 Other specified abnormal findings of blood chemistry: Secondary | ICD-10-CM

## 2015-09-14 LAB — BASIC METABOLIC PANEL
ANION GAP: 10 (ref 5–15)
BUN: 20 mg/dL (ref 6–20)
CALCIUM: 9.6 mg/dL (ref 8.9–10.3)
CO2: 23 mmol/L (ref 22–32)
CREATININE: 1.06 mg/dL — AB (ref 0.44–1.00)
Chloride: 105 mmol/L (ref 101–111)
GFR, EST AFRICAN AMERICAN: 56 mL/min — AB (ref >60)
GFR, EST NON AFRICAN AMERICAN: 48 mL/min — AB (ref >60)
Glucose, Bld: 138 mg/dL — ABNORMAL HIGH (ref 65–99)
Potassium: 4.2 mmol/L (ref 3.5–5.1)
SODIUM: 138 mmol/L (ref 135–145)

## 2015-09-14 LAB — FIBRIN DERIVATIVES D-DIMER (ARMC ONLY): Fibrin derivatives D-dimer (ARMC): 609 — ABNORMAL HIGH (ref 0–499)

## 2015-09-14 NOTE — Progress Notes (Signed)
Cardiology Office Note   Date:  09/14/2015   ID:  AQUEELAH BUREAU, DOB 1933-01-08, MRN [id number]  PCP:  Dion Body, MD  Cardiologist:   Kathlyn Sacramento, MD   Chief Complaint  Patient presents with  . other    Follow up from [facility]; chest pain. Meds reviewed by the patient verbally. "doing well."       History of Present Illness: Shelly Phillips is a 82 y.o. female who presents for a follow-up visit after recent hospitalization for chest pain. She has no previous cardiac history. She has known history of hypertension, hyperlipidemia and. Diabetes. She presented recently to [facility] with chest pain while she was shopping at [employer]. While she was there she  Suddenly had an episode of weakness which required her to sit down. She then developed substernal chest pain radiating to her back which was described as sharp discomfort. The pain was worse with taking a breath. Upon arrival to the emergency room, she was noted to have mild sinus tachycardia with a heart rate of 114 bpm she was anxious. Troponin was borderline elevated at 0.04 and then decreased. She underwent a pharmacologic nuclear stress test which showed no evidence of ischemia with normal ejection fraction.echocardiogram was also unremarkable. Evaluation for pulmonary embolism was recommended but the patient requested to be discharged home. She reports no further episodes since her hospital discharge and she is back to her normal self. She has no previous history of DVT.    Past Medical History  Diagnosis Date  . Rosacea   . Hypertension   . Skin cancer   . HLD (hyperlipidemia)   . Obesity   . Pre-diabetes     Past Surgical History  Procedure Laterality Date  . Fracture surgery    . Facial reconstruction surgery       Current Outpatient Prescriptions  Medication Sig Dispense Refill  . aspirin EC 81 MG tablet Take 1 tablet by mouth daily.    Marland Kitchen atorvastatin (LIPITOR) 20 MG tablet Take 20 mg by mouth daily at 6  PM.    . losartan-hydrochlorothiazide (HYZAAR) 100-12.5 MG tablet Take 1 tablet by mouth daily.    . metoprolol tartrate (LOPRESSOR) 25 MG tablet Take 0.5 tablets (12.5 mg total) by mouth 2 (two) times daily. 60 tablet 5  . Multiple Vitamin (MULTI-VITAMINS) TABS Take 1 tablet by mouth daily.     No current facility-administered medications for this visit.    Allergies:   Demerol    Social History:  The patient  reports that she has never smoked. She does not have any smokeless tobacco history on file. She reports that she does not drink alcohol or use illicit drugs.   Family History:  The patient's family history includes CAD in her father; Cancer in her brother; Parkinson's disease in her mother.       PHYSICAL EXAM: VS:  BP 110/80 mmHg  Pulse 80  Ht 5' 8.5" (1.74 m)  Wt 219 lb 8 oz (99.565 kg)  BMI 32.89 kg/m2  SpO2 98% , BMI Body mass index is 32.89 kg/(m^2). GEN: Well nourished, well developed, in no acute distress HEENT: normal Neck: no JVD, carotid bruits, or masses Cardiac: RRR; no murmurs, rubs, or gallops,no edema  Respiratory:  clear to auscultation bilaterally, normal work of breathing GI: soft, nontender, nondistended, + BS MS: no deformity or atrophy Skin: warm and dry, no rash Neuro:  Strength and sensation are intact Psych: euthymic mood, full affect   EKG:  EKG is ordered today. The ekg ordered today demonstrates normal sinus rhythm with no significant ST or T wave changes.   Recent Labs: 09/09/2015: Hemoglobin 14.0; Platelets 244 09/14/2015: BUN 20; Creatinine, Ser 1.06*; Potassium 4.2; Sodium 138    Lipid Panel    Component Value Date/Time   CHOL 133 09/10/2015 0119   TRIG 185* 09/10/2015 0119   HDL 48 09/10/2015 0119   CHOLHDL 2.8 09/10/2015 0119   VLDL 37 09/10/2015 0119   LDLCALC 48 09/10/2015 0119      Wt Readings from Last 3 Encounters:  09/14/15 219 lb 8 oz (99.565 kg)  09/09/15 220 lb (99.791 kg)         ASSESSMENT AND  PLAN:  1.  Pleuritic chest pain: this has resolved completely. Cardiac workup was negative including stress test and echocardiogram. She is currently back to her baseline and has no further symptoms. Nonetheless, her symptoms were definitely concerning enough to require evaluation for pulmonary embolism especially that the chest pain was pleuritic and she had mild sinus tachycardia. I'm going to check d-dimer today and if elevated I recommend evaluation with CTA of the pulmonary arteries.  2. Essential hypertension: blood pressure is well controlled on current medications.  3.  Hyperlipidemia: she is currently on atorvastatin.    Disposition:   FU as needed based on the above.  Signed, Kathlyn Sacramento, MD  09/14/2015 4:10 PM    [facility]

## 2015-09-14 NOTE — Patient Instructions (Signed)
Medication Instructions:  Your physician recommends that you continue on your current medications as directed. Please refer to the Current Medication list given to you today.   Labwork: D-Dimer and BMET today   Testing/Procedures: None at this time  Follow-Up: Your physician recommends that you schedule a follow-up appointment as needed.    Any Other Special Instructions Will Be Listed Below (If Applicable).     If you need a refill on your cardiac medications before your next appointment, please call your pharmacy.

## 2015-09-15 ENCOUNTER — Ambulatory Visit
Admission: RE | Admit: 2015-09-15 | Discharge: 2015-09-15 | Disposition: A | Discharge status: Home / Self Care | Payer: Medicare Other | Source: Ambulatory Visit | Attending: Cardiovascular Disease | Admitting: Cardiovascular Disease

## 2015-09-15 DIAGNOSIS — R918 Other nonspecific abnormal finding of lung field: Secondary | ICD-10-CM | POA: Insufficient documentation

## 2015-09-15 DIAGNOSIS — I2510 Atherosclerotic heart disease of native coronary artery without angina pectoris: Secondary | ICD-10-CM | POA: Diagnosis not present

## 2015-09-15 DIAGNOSIS — R791 Abnormal coagulation profile: Secondary | ICD-10-CM | POA: Insufficient documentation

## 2015-09-15 DIAGNOSIS — R7989 Other specified abnormal findings of blood chemistry: Secondary | ICD-10-CM

## 2015-09-15 MED ORDER — IOHEXOL 350 MG/ML SOLN
80.0000 mL | Freq: Once | INTRAVENOUS | Status: AC | PRN
  Administered 2015-09-15: 80 mL via INTRAVENOUS

## 2016-03-15 ENCOUNTER — Inpatient Hospital Stay
Admission: EM | Admit: 2016-03-15 | Discharge: 2016-03-17 | DRG: 683 | Disposition: A | Discharge status: Home / Self Care | Payer: Medicare Other | Attending: Internal Medicine | Admitting: Internal Medicine

## 2016-03-15 ENCOUNTER — Emergency Department: Payer: Medicare Other

## 2016-03-15 ENCOUNTER — Encounter: Payer: Self-pay | Admitting: Emergency Medicine

## 2016-03-15 DIAGNOSIS — R7989 Other specified abnormal findings of blood chemistry: Secondary | ICD-10-CM | POA: Diagnosis present

## 2016-03-15 DIAGNOSIS — E785 Hyperlipidemia, unspecified: Secondary | ICD-10-CM | POA: Diagnosis present

## 2016-03-15 DIAGNOSIS — R0602 Shortness of breath: Secondary | ICD-10-CM

## 2016-03-15 DIAGNOSIS — Z7982 Long term (current) use of aspirin: Secondary | ICD-10-CM | POA: Diagnosis not present

## 2016-03-15 DIAGNOSIS — N390 Urinary tract infection, site not specified: Secondary | ICD-10-CM

## 2016-03-15 DIAGNOSIS — N289 Disorder of kidney and ureter, unspecified: Secondary | ICD-10-CM

## 2016-03-15 DIAGNOSIS — Z66 Do not resuscitate: Secondary | ICD-10-CM | POA: Diagnosis present

## 2016-03-15 DIAGNOSIS — Z820 Family history of epilepsy and other diseases of the nervous system: Secondary | ICD-10-CM

## 2016-03-15 DIAGNOSIS — Z809 Family history of malignant neoplasm, unspecified: Secondary | ICD-10-CM | POA: Diagnosis not present

## 2016-03-15 DIAGNOSIS — N179 Acute kidney failure, unspecified: Principal | ICD-10-CM | POA: Diagnosis present

## 2016-03-15 DIAGNOSIS — Z8249 Family history of ischemic heart disease and other diseases of the circulatory system: Secondary | ICD-10-CM | POA: Diagnosis not present

## 2016-03-15 DIAGNOSIS — Z85828 Personal history of other malignant neoplasm of skin: Secondary | ICD-10-CM | POA: Diagnosis not present

## 2016-03-15 DIAGNOSIS — E669 Obesity, unspecified: Secondary | ICD-10-CM | POA: Diagnosis present

## 2016-03-15 DIAGNOSIS — Z79899 Other long term (current) drug therapy: Secondary | ICD-10-CM | POA: Diagnosis not present

## 2016-03-15 DIAGNOSIS — Z888 Allergy status to other drugs, medicaments and biological substances status: Secondary | ICD-10-CM | POA: Diagnosis not present

## 2016-03-15 DIAGNOSIS — E860 Dehydration: Secondary | ICD-10-CM | POA: Diagnosis present

## 2016-03-15 DIAGNOSIS — I10 Essential (primary) hypertension: Secondary | ICD-10-CM | POA: Diagnosis present

## 2016-03-15 LAB — LACTIC ACID, PLASMA
LACTIC ACID, VENOUS: 1.6 mmol/L (ref 0.5–1.9)
LACTIC ACID, VENOUS: 1.7 mmol/L (ref 0.5–1.9)
LACTIC ACID, VENOUS: 0.7 mmol/L (ref 0.5–1.9)

## 2016-03-15 LAB — BASIC METABOLIC PANEL
ANION GAP: 13 (ref 5–15)
BUN: 75 mg/dL — ABNORMAL HIGH (ref 6–20)
CHLORIDE: 94 mmol/L — AB (ref 101–111)
CO2: 21 mmol/L — ABNORMAL LOW (ref 22–32)
Calcium: 8.9 mg/dL (ref 8.9–10.3)
Creatinine, Ser: 3.11 mg/dL — ABNORMAL HIGH (ref 0.44–1.00)
GFR calc non Af Amer: 13 mL/min — ABNORMAL LOW (ref >60)
GFR, EST AFRICAN AMERICAN: 15 mL/min — AB (ref >60)
Glucose, Bld: 224 mg/dL — ABNORMAL HIGH (ref 65–99)
POTASSIUM: 3.3 mmol/L — AB (ref 3.5–5.1)
SODIUM: 128 mmol/L — AB (ref 135–145)

## 2016-03-15 LAB — HEPATIC FUNCTION PANEL
ALT: 156 U/L — AB (ref 14–54)
AST: 166 U/L — AB (ref 15–41)
Albumin: 3 g/dL — ABNORMAL LOW (ref 3.5–5.0)
Alkaline Phosphatase: 238 U/L — ABNORMAL HIGH (ref 38–126)
BILIRUBIN DIRECT: 0.3 mg/dL (ref 0.1–0.5)
Indirect Bilirubin: 0.3 mg/dL (ref 0.3–0.9)
TOTAL PROTEIN: 7.9 g/dL (ref 6.5–8.1)
Total Bilirubin: 0.6 mg/dL (ref 0.3–1.2)

## 2016-03-15 LAB — URINALYSIS COMPLETE WITH MICROSCOPIC (ARMC ONLY)
Bilirubin Urine: NEGATIVE
GLUCOSE, UA: 50 mg/dL — AB
Ketones, ur: NEGATIVE mg/dL
NITRITE: NEGATIVE
Protein, ur: 100 mg/dL — AB
SPECIFIC GRAVITY, URINE: 1.012 (ref 1.005–1.030)
pH: 5 (ref 5.0–8.0)

## 2016-03-15 LAB — CBC
HEMATOCRIT: 42.6 % (ref 35.0–47.0)
Hemoglobin: 14.6 g/dL (ref 12.0–16.0)
MCH: 30.6 pg (ref 26.0–34.0)
MCHC: 34.2 g/dL (ref 32.0–36.0)
MCV: 89.5 fL (ref 80.0–100.0)
Platelets: 280 10*3/uL (ref 150–440)
RBC: 4.76 MIL/uL (ref 3.80–5.20)
RDW: 14.1 % (ref 11.5–14.5)
WBC: 20.3 10*3/uL — ABNORMAL HIGH (ref 3.6–11.0)

## 2016-03-15 LAB — TROPONIN I: Troponin I: 0.03 ng/mL (ref <0.03)

## 2016-03-15 LAB — LIPASE, BLOOD: Lipase: 36 U/L (ref 11–51)

## 2016-03-15 MED ORDER — SODIUM CHLORIDE 0.9 % IV BOLUS (SEPSIS)
500.0000 mL | Freq: Once | INTRAVENOUS | Status: AC
  Administered 2016-03-15: 20:00:00 500 mL via INTRAVENOUS

## 2016-03-15 MED ORDER — HEPARIN SODIUM (PORCINE) 5000 UNIT/ML IJ SOLN
5000.0000 [IU] | Freq: Three times a day (TID) | INTRAMUSCULAR | Status: DC
  Administered 2016-03-15 – 2016-03-17 (×6): 5000 [IU] via SUBCUTANEOUS
  Filled 2016-03-15 (×6): qty 1

## 2016-03-15 MED ORDER — SODIUM CHLORIDE 0.9 % IV BOLUS (SEPSIS)
1000.0000 mL | Freq: Once | INTRAVENOUS | Status: AC
  Administered 2016-03-15: 1000 mL via INTRAVENOUS

## 2016-03-15 MED ORDER — SODIUM CHLORIDE 0.9% FLUSH
3.0000 mL | Freq: Two times a day (BID) | INTRAVENOUS | Status: DC
  Administered 2016-03-16 – 2016-03-17 (×2): 3 mL via INTRAVENOUS

## 2016-03-15 MED ORDER — POTASSIUM CHLORIDE CRYS ER 20 MEQ PO TBCR
EXTENDED_RELEASE_TABLET | ORAL | Status: AC
  Administered 2016-03-15: 20 meq via ORAL
  Filled 2016-03-15: qty 1

## 2016-03-15 MED ORDER — DEXTROSE 5 % IV SOLN
1.0000 g | Freq: Once | INTRAVENOUS | Status: AC
  Administered 2016-03-15: 1 g via INTRAVENOUS
  Filled 2016-03-15: qty 10

## 2016-03-15 MED ORDER — OXYCODONE HCL 5 MG PO TABS: 5.0000 mg | ORAL_TABLET | Freq: Four times a day (QID) | ORAL | Status: DC | PRN

## 2016-03-15 MED ORDER — ACETAMINOPHEN 500 MG PO TABS: 1000.0000 mg | ORAL_TABLET | Freq: Every day | ORAL | Status: DC

## 2016-03-15 MED ORDER — ASPIRIN EC 81 MG PO TBEC
81.0000 mg | DELAYED_RELEASE_TABLET | Freq: Every day | ORAL | Status: DC
  Administered 2016-03-15 – 2016-03-17 (×3): 81 mg via ORAL
  Filled 2016-03-15 (×3): qty 1

## 2016-03-15 MED ORDER — POTASSIUM CHLORIDE CRYS ER 20 MEQ PO TBCR
20.0000 meq | EXTENDED_RELEASE_TABLET | Freq: Two times a day (BID) | ORAL | Status: AC
  Administered 2016-03-15 – 2016-03-16 (×3): 20 meq via ORAL
  Filled 2016-03-15 (×2): qty 1

## 2016-03-15 MED ORDER — FLUTICASONE PROPIONATE 50 MCG/ACT NA SUSP
2.0000 | Freq: Every day | NASAL | Status: DC
  Administered 2016-03-16 – 2016-03-17 (×2): 2 via NASAL
  Filled 2016-03-15: qty 16

## 2016-03-15 MED ORDER — SALINE SPRAY 0.65 % NA SOLN
2.0000 | Freq: Two times a day (BID) | NASAL | Status: DC
  Administered 2016-03-16 – 2016-03-17 (×2): 2 via NASAL
  Filled 2016-03-15: qty 44

## 2016-03-15 MED ORDER — METOPROLOL TARTRATE 25 MG PO TABS
25.0000 mg | ORAL_TABLET | Freq: Two times a day (BID) | ORAL | Status: DC
  Administered 2016-03-15 – 2016-03-17 (×4): 25 mg via ORAL
  Filled 2016-03-15 (×4): qty 1

## 2016-03-15 MED ORDER — ATORVASTATIN CALCIUM 20 MG PO TABS
20.0000 mg | ORAL_TABLET | Freq: Every day | ORAL | Status: DC
  Administered 2016-03-16: 21:00:00 20 mg via ORAL
  Filled 2016-03-15: qty 1

## 2016-03-15 MED ORDER — DILTIAZEM HCL 60 MG PO TABS
60.0000 mg | ORAL_TABLET | Freq: Three times a day (TID) | ORAL | Status: DC
  Administered 2016-03-15 – 2016-03-17 (×5): 60 mg via ORAL
  Filled 2016-03-15 (×6): qty 1

## 2016-03-15 MED ORDER — TRIAMCINOLONE ACETONIDE 55 MCG/ACT NA AERO: 1.0000 | INHALATION_SPRAY | Freq: Two times a day (BID) | NASAL | Status: DC

## 2016-03-15 MED ORDER — SODIUM CHLORIDE 0.9 % IV SOLN
INTRAVENOUS | Status: AC
  Administered 2016-03-15: 19:00:00 via INTRAVENOUS

## 2016-03-15 MED ORDER — DEXTROSE 5 % IV SOLN
1.0000 g | INTRAVENOUS | Status: DC
  Administered 2016-03-16: 1 g via INTRAVENOUS
  Filled 2016-03-15: qty 10

## 2016-03-15 MED ORDER — DILTIAZEM HCL 25 MG/5ML IV SOLN
10.0000 mg | Freq: Once | INTRAVENOUS | Status: AC
  Administered 2016-03-15: 10 mg via INTRAVENOUS
  Filled 2016-03-15: qty 5

## 2016-03-15 NOTE — Progress Notes (Signed)
Notified on call MD, Dr. Darvin Neighbours, patient's HR 140-150's and oxygen saturations 86% on room air sustaining after being back to bed for 10 min from bathroom.   Dr. Darvin Neighbours to place new orders. Report given to [facility] and aware with plans.

## 2016-03-15 NOTE — ED Notes (Signed)
Patient transported to X-ray 

## 2016-03-15 NOTE — ED Notes (Signed)
Pt resting in bed, pt awake and alert in no acute distress 

## 2016-03-15 NOTE — ED Provider Notes (Signed)
-----------------------------------------   5:18 PM on 03/15/2016 -----------------------------------------  Patient's labs are resulted showing acute renal failure with a creatinine greater than 3. Significant urinary tract infection. Elevated LFTs, normal ultrasound. Clear chest x-ray. I highly suspect the patient's weakness is due to acute renal failure likely caused by dehydration, with urinary tract infection exacerbating symptoms. We will cover with IV antibiotics, and admit to the hospitalist for further treatment and IV fluids.   Harvest Dark, MD 03/15/16 1719

## 2016-03-15 NOTE — ED Provider Notes (Addendum)
[facility] Emergency Department Provider Note  ____________________________________________   I have reviewed the triage vital signs and the nursing notes.   HISTORY  Chief Complaint Shortness of Breath; Weakness; and Cough    HPI Shelly Phillips is a 82 y.o. female who presents today complaining of feeling not very good. Is very vaguely describes what brought her in today. Her biggest concern is that she has difficulty eating. She states when she eats she feels nauseated and she has vomiting. She's been doing this for "a long time". Patient states that she had a URI a few weeks ago, was placed on antibiotics by the description that she gives(2 pills the first day and then one pill for a few days after that) which she finished. She states that since that time, she is just not had any appetite. She states her cough and shortness of breath are better she's not complaining of those anymore but she does doesn't feel "right". She feels weak as a kitten. She feels dehydrated. She states that she has had no significant abdominal pain no melena no bright red blood per rectum. She has not had a bowel movement recently. She cannot recall if her last bowel movement was loose or not.     Past Medical History:  Diagnosis Date  . HLD (hyperlipidemia)   . Hypertension   . Obesity   . Pre-diabetes   . Rosacea   . Skin cancer     Patient Active Problem List   Diagnosis Date Noted  . Unstable angina ([place]) 09/10/2015  . Essential hypertension 09/10/2015  . Hyperlipidemia 09/10/2015  . Morbid obesity ([place]) 09/10/2015  . Pre-diabetes 09/10/2015  . Pleuritic chest pain 09/10/2015  . Elevated troponin 09/10/2015  . Renal insufficiency 09/10/2015  . Leukocytosis 09/10/2015  . Chest pain 09/09/2015    Past Surgical History:  Procedure Laterality Date  . FACIAL RECONSTRUCTION SURGERY    . FRACTURE SURGERY      Prior to Admission medications   Medication Sig Start  Date End Date Taking? Authorizing Provider  aspirin EC 81 MG tablet Take 1 tablet by mouth daily. 02/27/15   Historical Provider, MD  atorvastatin (LIPITOR) 20 MG tablet Take 20 mg by mouth daily at 6 PM.    Historical Provider, MD  losartan-hydrochlorothiazide (HYZAAR) 100-12.5 MG tablet Take 1 tablet by mouth daily.    Historical Provider, MD  metoprolol tartrate (LOPRESSOR) 25 MG tablet Take 0.5 tablets (12.5 mg total) by mouth 2 (two) times daily. 09/10/15   Theodoro Grist, MD  Multiple Vitamin (MULTI-VITAMINS) TABS Take 1 tablet by mouth daily.    Historical Provider, MD    Allergies Demerol [meperidine]  Family History  Problem Relation Age of Onset  . CAD Father   . Cancer Brother   . Parkinson's disease Mother     Social History Social History  Substance Use Topics  . Smoking status: Never Smoker  . Smokeless tobacco: Never Used  . Alcohol use No    Review of Systems Constitutional: No fever/chills Eyes: No visual changes. ENT: No sore throat. No stiff neck no neck pain Cardiovascular: Denies chest pain. Respiratory: Denies shortness of breath. Gastrointestinal:   The history of present illness Genitourinary: Negative for dysuria. Musculoskeletal: Negative lower extremity swelling Skin: Negative for rash. Neurological: Negative for severe headaches, focal weakness or numbness. 10-point ROS otherwise negative.  ____________________________________________   PHYSICAL EXAM:  VITAL SIGNS: ED Triage Vitals  Enc Vitals Group     BP 03/15/16  1311 (!) 114/56     Pulse Rate 03/15/16 1311 (!) 108     Resp 03/15/16 1311 17     Temp 03/15/16 1311 99.4 F (37.4 C)     Temp Source 03/15/16 1311 Oral     SpO2 03/15/16 1303 94 %     Weight 03/15/16 1312 217 lb (98.4 kg)     Height 03/15/16 1312 5\' 5"  (1.651 m)     Head Circumference --      Peak Flow --      Pain Score --      Pain Loc --      Pain Edu? --      Excl. in [place]? --     Constitutional: Alert and  oriented. Well appearing and in no acute distress. Eyes: Conjunctivae are normal. PERRL. EOMI. Head: Atraumatic. Nose: No congestion/rhinnorhea. Mouth/Throat: Somewhat dry membranes are moist.  Oropharynx non-erythematous. Neck: No stridor.   Nontender with no meningismus Cardiovascular: Normal rate, regular rhythm. Grossly normal heart sounds.  Good peripheral circulation. Respiratory: Normal respiratory effort.  No retractions. Lungs CTAB. Abdominal: Soft and minimally diffusely discomforting. No distention. No guarding no rebound Back:  There is no focal tenderness or step off.  there is no midline tenderness there are no lesions noted. there is no CVA tenderness Musculoskeletal: No lower extremity tenderness, no upper extremity tenderness. No joint effusions, no DVT signs strong distal pulses no edema Neurologic:  Normal speech and language. No gross focal neurologic deficits are appreciated.  Skin:  Skin is warm, dry and intact. No rash noted. Psychiatric: Mood and affect are normal. Speech and behavior are normal.  ____________________________________________   LABS (all labs ordered are listed, but only abnormal results are displayed)  Labs Reviewed  CBC - Abnormal; Notable for the following:       Result Value   WBC 20.3 (*)    All other components within normal limits  URINALYSIS COMPLETEWITH MICROSCOPIC ([facility] ONLY)  LACTIC ACID, PLASMA  LACTIC ACID, PLASMA  HEPATIC FUNCTION PANEL  TROPONIN I  LIPASE, BLOOD  BASIC METABOLIC PANEL   ____________________________________________  EKG  I personally interpreted any EKGs ordered by me or triage Sinus tachycardia rate 109 bpm no acute ST elevation or depression, nonspecific ST change ____________________________________________  RADIOLOGY  I reviewed any imaging ordered by me or triage that were performed during my shift and, if possible, patient and/or family made aware of any abnormal  findings. ____________________________________________   PROCEDURES  Procedure(s) performed: None  Procedures  Critical Care performed: None  ____________________________________________   INITIAL IMPRESSION / ASSESSMENT AND PLAN / ED COURSE  Pertinent labs & imaging results that were available during my care of the patient were reviewed by me and considered in my medical decision making (see chart for details).  Patient with anorexia, nausea and vomiting, and general weakness at age 42. Chest x-ray is reassuring lung exam is reassuring abdominal exam is nonsurgical, blood work is pending, signed out at the end of my shift.  ----------------------------------------- 3:32 PM on 03/15/2016 -----------------------------------------   signed out to dr. Kerman Passey at the end of my shift.  Clinical Course   ____________________________________________   FINAL CLINICAL IMPRESSION(S) / ED DIAGNOSES  Final diagnoses:  SOB (shortness of breath)      This chart was dictated using voice recognition software.  Despite best efforts to proofread,  errors can occur which can change meaning.      Schuyler Amor, MD 03/15/16 [place]  Burlene Arnt, MD 03/15/16 [phone]

## 2016-03-15 NOTE — H&P (Signed)
[facility] [place] NAME: Shelly Phillips    MR#:  [id number]  DATE OF BIRTH:  09/08/1932  DATE OF ADMISSION:  03/15/2016  PRIMARY CARE PHYSICIAN: Dion Body, MD   REQUESTING/REFERRING PHYSICIAN: Dr.Paduchowski  CHIEF COMPLAINT:   Chief Complaint  Patient presents with  . Shortness of Breath  . Weakness  . Cough    HISTORY OF PRESENT ILLNESS: Shelly Phillips  is a 82 y.o. female with a known history of Hyperlipidemia, hypertension, skin cancer, obesity- lives alone and very independent in her day-to-day activities with her neighbors helping her for any requirements. 7-8 days ago she started having some congestion in her sinuses and tried some nasal sprays which did not help much and she started having some postnasal drip and also felt that she started coughing after that. Went to her primary care doctor and they prescribed azithromycin oral which she took for 5 days but not much help.   She started feeling very weak for last 2-3 days and not having good oral intake and also has some burning in the urination so finally decided to come to emergency room today. In ER she is noted to have worsening in the renal function and UTI so she is given his admission to hospitalist team.  PAST MEDICAL HISTORY:   Past Medical History:  Diagnosis Date  . HLD (hyperlipidemia)   . Hypertension   . Obesity   . Pre-diabetes   . Rosacea   . Skin cancer     PAST SURGICAL HISTORY: Past Surgical History:  Procedure Laterality Date  . FACIAL RECONSTRUCTION SURGERY    . FRACTURE SURGERY      SOCIAL HISTORY:  Social History  Substance Use Topics  . Smoking status: Never Smoker  . Smokeless tobacco: Never Used  . Alcohol use No    FAMILY HISTORY:  Family History  Problem Relation Age of Onset  . CAD Father   . Cancer Brother   . Parkinson's disease Mother     DRUG ALLERGIES:  Allergies  Allergen Reactions  . Demerol [Meperidine] Nausea  And Vomiting    REVIEW OF SYSTEMS:   CONSTITUTIONAL: No fever,Positive for fatigue or weakness.  EYES: No blurred or double vision.  EARS, NOSE, AND THROAT: No tinnitus or ear pain.  RESPIRATORY: Positive for cough, no shortness of breath, wheezing or hemoptysis.  CARDIOVASCULAR: No chest pain, orthopnea, edema.  GASTROINTESTINAL: No nausea, vomiting, diarrhea or abdominal pain.  GENITOURINARY: No dysuria, hematuria. Have some burning in the urination. ENDOCRINE: No polyuria, nocturia,  HEMATOLOGY: No anemia, easy bruising or bleeding SKIN: No rash or lesion. MUSCULOSKELETAL: No joint pain or arthritis.   NEUROLOGIC: No tingling, numbness, weakness.  PSYCHIATRY: No anxiety or depression.   MEDICATIONS AT HOME:  Prior to Admission medications   Medication Sig Start Date End Date Taking? Authorizing Provider  acetaminophen (TYLENOL) 500 MG tablet Take 1,000 mg by mouth at bedtime.   Yes Historical Provider, MD  aspirin EC 81 MG tablet Take 1 tablet by mouth daily. 02/27/15  Yes Historical Provider, MD  atorvastatin (LIPITOR) 20 MG tablet Take 20 mg by mouth daily at 6 PM.   Yes Historical Provider, MD  ibuprofen (ADVIL,MOTRIN) 200 MG tablet Take 400 mg by mouth 2 (two) times daily.   Yes Historical Provider, MD  losartan-hydrochlorothiazide (HYZAAR) 100-12.5 MG tablet Take 1 tablet by mouth daily.   Yes Historical Provider, MD  sodium chloride (OCEAN) 0.65 % SOLN nasal spray Place 2  sprays into both nostrils 2 (two) times daily.   Yes Historical Provider, MD  triamcinolone (NASACORT ALLERGY 24HR) 55 MCG/ACT AERO nasal inhaler Place 1 spray into the nose 2 (two) times daily.   Yes Historical Provider, MD  azithromycin (ZITHROMAX) 250 MG tablet Take 250 mg by mouth daily.    Historical Provider, MD  metoprolol tartrate (LOPRESSOR) 25 MG tablet Take 0.5 tablets (12.5 mg total) by mouth 2 (two) times daily. Patient not taking: Reported on 03/15/2016 09/10/15   Theodoro Grist, MD       PHYSICAL EXAMINATION:   VITAL SIGNS: Blood pressure 111/82, pulse 99, temperature 99.4 F (37.4 C), temperature source Oral, resp. rate 16, height 5\' 5"  (1.651 m), weight 98.4 kg (217 lb), SpO2 99 %.  GENERAL:  82 y.o.-year-old patient lying in the bed with no acute distress.  EYES: Pupils equal, round, reactive to light and accommodation. No scleral icterus. Extraocular muscles intact.  HEENT: Head atraumatic, normocephalic. Oropharynx and nasopharynx clear.Some tenderness around paranasal sinuses and redness around nose.  NECK:  Supple, no jugular venous distention. No thyroid enlargement, no tenderness.  LUNGS: Normal breath sounds bilaterally, no wheezing, rales,rhonchi or crepitation. No use of accessory muscles of respiration.  CARDIOVASCULAR: S1, S2 normal. No murmurs, rubs, or gallops.  ABDOMEN: Soft, nontender, nondistended. Bowel sounds present. No organomegaly or mass.  EXTREMITIES: No pedal edema, cyanosis, or clubbing.  NEUROLOGIC: Cranial nerves II through XII are intact. Muscle strength 4/5 in all extremities. Sensation intact. Gait not checked.  PSYCHIATRIC: The patient is alert and oriented x 3.  SKIN: No obvious rash, lesion, or ulcer.   LABORATORY PANEL:   CBC  Recent Labs Lab 03/15/16 1307  WBC 20.3*  HGB 14.6  HCT 42.6  PLT 280  MCV 89.5  MCH 30.6  MCHC 34.2  RDW 14.1   ------------------------------------------------------------------------------------------------------------------  Chemistries   Recent Labs Lab 03/15/16 1503  NA 128*  K 3.3*  CL 94*  CO2 21*  GLUCOSE 224*  BUN 75*  CREATININE 3.11*  CALCIUM 8.9  AST 166*  ALT 156*  ALKPHOS 238*  BILITOT 0.6   ------------------------------------------------------------------------------------------------------------------ estimated creatinine clearance is 16.5 mL/min (by C-G formula based on SCr of 3.11  mg/dL). ------------------------------------------------------------------------------------------------------------------ No results for input(s): TSH, T4TOTAL, T3FREE, THYROIDAB in the last 72 hours.  Invalid input(s): FREET3   Coagulation profile No results for input(s): INR, PROTIME in the last 168 hours. ------------------------------------------------------------------------------------------------------------------- No results for input(s): DDIMER in the last 72 hours. -------------------------------------------------------------------------------------------------------------------  Cardiac Enzymes  Recent Labs Lab 03/15/16 1503  TROPONINI 0.03*   ------------------------------------------------------------------------------------------------------------------ Invalid input(s): POCBNP  ---------------------------------------------------------------------------------------------------------------  Urinalysis    Component Value Date/Time   COLORURINE YELLOW (A) 03/15/2016 1503   APPEARANCEUR CLOUDY (A) 03/15/2016 1503   LABSPEC 1.012 03/15/2016 1503   PHURINE 5.0 03/15/2016 1503   GLUCOSEU 50 (A) 03/15/2016 1503   HGBUR 3+ (A) 03/15/2016 1503   BILIRUBINUR NEGATIVE 03/15/2016 1503   KETONESUR NEGATIVE 03/15/2016 1503   PROTEINUR 100 (A) 03/15/2016 1503   NITRITE NEGATIVE 03/15/2016 1503   LEUKOCYTESUR 3+ (A) 03/15/2016 1503     RADIOLOGY: Dg Chest 2 View  Result Date: 03/15/2016 CLINICAL DATA:  Shortness of breath. EXAM: CHEST  2 VIEW COMPARISON:  CT chest 09/15/2015 FINDINGS: There is no focal parenchymal opacity. There is no pleural effusion or pneumothorax. The heart and mediastinal contours are unremarkable. The osseous structures are unremarkable. IMPRESSION: No active cardiopulmonary disease. Electronically Signed   By: Kathreen Devoid   On: 03/15/2016 14:53  US Abdomen Limited Ruq  Result Date: 03/15/2016 CLINICAL DATA:  Elevated LFTs. EXAM: US ABDOMEN  LIMITED - RIGHT UPPER QUADRANT COMPARISON:  CT 09/15/2015. FINDINGS: Gallbladder: No gallstones or wall thickening visualized. No sonographic Murphy sign noted by sonographer. Common bile duct: Diameter: 5.4 mm Liver: No focal lesion identified. Within normal limits in parenchymal echogenicity. IMPRESSION: Negative exam. Electronically Signed   By: Marcello Moores  Register   On: 03/15/2016 17:07    EKG: Orders placed or performed during the hospital encounter of 03/15/16  . EKG 12-Lead  . EKG 12-Lead    IMPRESSION AND PLAN:  * Acute renal failure.   IV fluid and monitor renal function tomorrow.  * UTI   Urine culture is sent from ER, follow up.   IV ceftriaxone.  * Sinusitis   She already finished azithromycin orally.   Continue nasal spray, ceftriaxone might help with some infection also.  * Hyperlipidemia   Continue atorvastatin.  * Hypertension   Currently blood pressure is running on lower side and she is dehydrated so I'll hold her hypertensive medications.  All the records are reviewed and case discussed with ED provider. Management plans discussed with the patient, family and they are in agreement.  CODE STATUS: Full code Code Status History    Date Active Date Inactive Code Status Order ID Comments User Context   09/09/2015  6:07 PM 09/10/2015  8:48 PM Full Code [id number]  Nicholes Mango, MD Inpatient    Advance Directive Documentation   Flowsheet Row Most Recent Value  Type of Advance Directive  Living will  Pre-existing out of facility DNR order (yellow form or pink MOST form)  No data  "MOST" Form in Place?  No data     Her neighbor was present in the room during my visit. Patient said she would like to update her living will and put her neighbor as her power of attorney as he knows about all her conditions and will be able to make better decisions than her son, and she would like to be DO NOT RESUSCITATE in any adverse event. I'll call chaplain to help her make this legal  paperwork .  TOTAL TIME TAKING CARE OF THIS PATIENT: 50 minutes.    Vaughan Basta M.D on 03/15/2016   Between 7am to 6pm - Pager - [phone]  After 6pm go to [email or web address] - password [email or web address] [facility]  Office  [phone]  CC: Primary care physician; Dion Body, MD   Note: This dictation was prepared with Dragon dictation along with smaller phrase technology. Any transcriptional errors that result from this process are unintentional.

## 2016-03-15 NOTE — ED Notes (Signed)
EDP at bedside  

## 2016-03-15 NOTE — ED Triage Notes (Addendum)
Pt arrived to ED via ems with complaints of coughing, Sob and weakness. Pt states started on Z pak for Samuel [place] but states it made her feel worse. Lung expansion is equal and unlabored, clear breath sounds bilaterally. Pt states she has not been eating or drinking because she does not feel up to it. Eating and drinking induces N/V.

## 2016-03-15 NOTE — ED Notes (Signed)
EDP notified of critical trop. 

## 2016-03-16 LAB — CBC
HCT: 37.3 % (ref 35.0–47.0)
Hemoglobin: 12.8 g/dL (ref 12.0–16.0)
MCH: 31.1 pg (ref 26.0–34.0)
MCHC: 34.4 g/dL (ref 32.0–36.0)
MCV: 90.5 fL (ref 80.0–100.0)
PLATELETS: 226 10*3/uL (ref 150–440)
RBC: 4.12 MIL/uL (ref 3.80–5.20)
RDW: 13.9 % (ref 11.5–14.5)
WBC: 15.5 10*3/uL — ABNORMAL HIGH (ref 3.6–11.0)

## 2016-03-16 LAB — BASIC METABOLIC PANEL
Anion gap: 8 (ref 5–15)
BUN: 58 mg/dL — AB (ref 6–20)
CALCIUM: 8.5 mg/dL — AB (ref 8.9–10.3)
CHLORIDE: 104 mmol/L (ref 101–111)
CO2: 24 mmol/L (ref 22–32)
CREATININE: 1.93 mg/dL — AB (ref 0.44–1.00)
GFR calc non Af Amer: 23 mL/min — ABNORMAL LOW (ref >60)
GFR, EST AFRICAN AMERICAN: 27 mL/min — AB (ref >60)
GLUCOSE: 117 mg/dL — AB (ref 65–99)
Potassium: 3.2 mmol/L — ABNORMAL LOW (ref 3.5–5.1)
Sodium: 136 mmol/L (ref 135–145)

## 2016-03-16 MED ORDER — POTASSIUM CHLORIDE 20 MEQ PO PACK
40.0000 meq | PACK | Freq: Once | ORAL | Status: AC
  Administered 2016-03-16: 11:00:00 40 meq via ORAL
  Filled 2016-03-16: qty 2

## 2016-03-16 NOTE — Evaluation (Signed)
Physical Therapy Evaluation Patient Details Name: TAMYRAH BLANCETT MRN: [id number] DOB: 03/27/1933 Today's Date: 03/16/2016   History of Present Illness  Pt is admitted for ARF. Pt with complaints of SOB and weakness. Pt now on 2L of O2 at this time. History includes HTN, skin cancer, obesity and was independent prior to admission.  Clinical Impression  Pt is a pleasant 82 year old female who was admitted for ARF with complaints of SOB and weakness. Pt performs bed mobility independently and transfers/ambulation with cga and no AD. All mobility performed on room air. Pt demonstrates deficits with endurance/strength/mobility. Would benefit from skilled PT to address above deficits and promote optimal return to PLOF. Recommend transition to [facility] upon discharge from acute hospitalization.       Follow Up Recommendations Home health PT    Equipment Recommendations  None recommended by PT    Recommendations for Other Services       Precautions / Restrictions Precautions Precautions: Fall Restrictions Weight Bearing Restrictions: No      Mobility  Bed Mobility Overal bed mobility: Independent             General bed mobility comments: safe technique performed  Transfers Overall transfer level: Needs assistance Equipment used: None Transfers: Sit to/from Stand Sit to Stand: Min guard         General transfer comment: safe technique, slight unsteadiness upon initial standing, however improved after a few seconds.  Ambulation/Gait Ambulation/Gait assistance: Min guard Ambulation Distance (Feet): 80 Feet Assistive device:  (pushed IV pole) Gait Pattern/deviations: Step-through pattern     General Gait Details: safe technique performed with no LOB noted. Pt with slow speed. All mobility performed on room air with sats at 92%. Slight dip with ambulation to 85%, however quickly improved with cues for pursed lip breathing.  Stairs            Wheelchair Mobility     Modified Rankin (Stroke Patients Only)       Balance Overall balance assessment: History of Falls                                           Pertinent Vitals/Pain Pain Assessment: No/denies pain    Home Living Family/patient expects to be discharged to:: Private residence Living Arrangements: Alone   Type of Home: House Home Access:  (TBD)     Home Layout: One level Home Equipment: None      Prior Function Level of Independence: Independent               Hand Dominance        Extremity/Trunk Assessment   Upper Extremity Assessment: Overall WFL for tasks assessed           Lower Extremity Assessment: Overall WFL for tasks assessed         Communication   Communication: No difficulties  Cognition Arousal/Alertness: Awake/alert Behavior During Therapy: WFL for tasks assessed/performed Overall Cognitive Status: Within Functional Limits for tasks assessed                      General Comments      Exercises Other Exercises Other Exercises: ambulated to bathroom, needing supervision for set up. Safe technique with pt able to perform self hygiene with cues.      Assessment/Plan    PT Assessment Patient needs continued PT services  PT Diagnosis Difficulty walking;Generalized weakness   PT Problem List Decreased strength;Decreased activity tolerance  PT Treatment Interventions Gait training;Therapeutic exercise;Balance training   PT Goals (Current goals can be found in the Care Plan section) Acute Rehab PT Goals Patient Stated Goal: to go home PT Goal Formulation: With patient Time For Goal Achievement: 03/30/16 Potential to Achieve Goals: Good    Frequency Min 2X/week   Barriers to discharge        Co-evaluation               End of Session Equipment Utilized During Treatment: Gait belt Activity Tolerance: Patient tolerated treatment well Patient left: in bed;with bed alarm set Nurse Communication:  Mobility status         Time: [id number] PT Time Calculation (min) (ACUTE ONLY): 17 min   Charges:   PT Evaluation $PT Eval Moderate Complexity: 1 Procedure PT Treatments $Therapeutic Activity: 8-22 mins   PT G Codes:        Wataru Mccowen Mar 25, 2016, 5:13 PM  Greggory Stallion, PT, DPT [phone]

## 2016-03-16 NOTE — Progress Notes (Signed)
   03/16/16 0800  Clinical Encounter Type  Visited With Patient  Referral From Nurse  Consult/Referral To Chaplain  Spiritual Encounters  Spiritual Needs Literature;Prayer  Stress Factors  Patient Stress Factors Exhausted;Health changes  Visited w/patient and provided AD education. Patient did not express interest in pursuing an AD. Advised if she changes her mind that we can pursue it later. We visited about her family. She is the sole surviving member of her family and feels the loneliness of that loss. However, she remains in good spirits about life and expresses strong optimism. We concluded the visit with prayer at her request. Chap. Lenis Nettleton G. Mosby

## 2016-03-16 NOTE — Progress Notes (Addendum)
[facility] [place] NAME: Jashiya Scribner    MR#:  [id number]  DATE OF BIRTH:  08/12/1932  SUBJECTIVE:  CHIEF COMPLAINT:  Patient is feeling fine. Denies any nausea vomiting or abdominal pain at this time  REVIEW OF SYSTEMS:  CONSTITUTIONAL: No fever, fatigue or weakness.  EYES: No blurred or double vision.  EARS, NOSE, AND THROAT: No tinnitus or ear pain.  RESPIRATORY: No cough, shortness of breath, wheezing or hemoptysis.  CARDIOVASCULAR: No chest pain, orthopnea, edema.  GASTROINTESTINAL: No nausea, vomiting, diarrhea or abdominal pain.  GENITOURINARY: No dysuria, hematuria.  ENDOCRINE: No polyuria, nocturia,  HEMATOLOGY: No anemia, easy bruising or bleeding SKIN: No rash or lesion. MUSCULOSKELETAL: No joint pain or arthritis.   NEUROLOGIC: No tingling, numbness, weakness.  PSYCHIATRY: No anxiety or depression.   DRUG ALLERGIES:   Allergies  Allergen Reactions  . Demerol [Meperidine] Nausea And Vomiting    VITALS:  Blood pressure 124/67, pulse 72, temperature 98.7 F (37.1 C), temperature source Oral, resp. rate (!) 22, height 5\' 5"  (1.651 m), weight 98.4 kg (217 lb), SpO2 100 %.  PHYSICAL EXAMINATION:  GENERAL:  82 y.o.-year-old patient lying in the bed with no acute distress.  EYES: Pupils equal, round, reactive to light and accommodation. No scleral icterus. Extraocular muscles intact.  HEENT: Head atraumatic, normocephalic. Oropharynx and nasopharynx clear.  NECK:  Supple, no jugular venous distention. No thyroid enlargement, no tenderness.  LUNGS: Normal breath sounds bilaterally, no wheezing, rales,rhonchi or crepitation. No use of accessory muscles of respiration.  CARDIOVASCULAR: S1, S2 normal. No murmurs, rubs, or gallops.  ABDOMEN: Soft, nontender, nondistended. Bowel sounds present. No organomegaly or mass.  EXTREMITIES: No pedal edema, cyanosis, or clubbing.  NEUROLOGIC: Cranial nerves II through XII are  intact. Muscle strength 5/5 in all extremities. Sensation intact. Gait not checked.  PSYCHIATRIC: The patient is alert and oriented x 3.  SKIN: No obvious rash, lesion, or ulcer.    LABORATORY PANEL:   CBC  Recent Labs Lab 03/16/16 0441  WBC 15.5*  HGB 12.8  HCT 37.3  PLT 226   ------------------------------------------------------------------------------------------------------------------  Chemistries   Recent Labs Lab 03/15/16 1503 03/16/16 0441  NA 128* 136  K 3.3* 3.2*  CL 94* 104  CO2 21* 24  GLUCOSE 224* 117*  BUN 75* 58*  CREATININE 3.11* 1.93*  CALCIUM 8.9 8.5*  AST 166*  --   ALT 156*  --   ALKPHOS 238*  --   BILITOT 0.6  --    ------------------------------------------------------------------------------------------------------------------  Cardiac Enzymes  Recent Labs Lab 03/15/16 1503  TROPONINI 0.03*   ------------------------------------------------------------------------------------------------------------------  RADIOLOGY:  Dg Chest 2 View  Result Date: 03/15/2016 CLINICAL DATA:  Shortness of breath. EXAM: CHEST  2 VIEW COMPARISON:  CT chest 09/15/2015 FINDINGS: There is no focal parenchymal opacity. There is no pleural effusion or pneumothorax. The heart and mediastinal contours are unremarkable. The osseous structures are unremarkable. IMPRESSION: No active cardiopulmonary disease. Electronically Signed   By: Kathreen Devoid   On: 03/15/2016 14:53   US Abdomen Limited Ruq  Result Date: 03/15/2016 CLINICAL DATA:  Elevated LFTs. EXAM: US ABDOMEN LIMITED - RIGHT UPPER QUADRANT COMPARISON:  CT 09/15/2015. FINDINGS: Gallbladder: No gallstones or wall thickening visualized. No sonographic Murphy sign noted by sonographer. Common bile duct: Diameter: 5.4 mm Liver: No focal lesion identified. Within normal limits in parenchymal echogenicity. IMPRESSION: Negative exam. Electronically Signed   ByMarcello Moores  Register   On: 03/15/2016 17:07  EKG:    Orders placed or performed during the hospital encounter of 03/15/16  . EKG 12-Lead  . EKG 12-Lead    ASSESSMENT AND PLAN:   * Acute renal failurePrerenal from poor by mouth intake   IV fluid and monitor renal function tomorrow. Creatinine is improving. 3.11-1.93 GFR is improving 15-27  * UTI   Urine culture is sent from ER, follow up.  IV Rocephin Will be continued   * Sinusitis  currently on Rocephin   She has completed outpatient course of azithromycin orally.     * Hyperlipidemia   Continue atorvastatin.  * Hypertension    resume home medication Lopressor 25 mg by mouth twice a day   Generalized weakness PT consult placed  All the records are reviewed and case discussed with RN Management plans discussed with the patient, family and they are in agreement.      All the records are reviewed and case discussed with Care Management/Social Workerr. Management plans discussed with the patient, family and they are in agreement.  CODE STATUS: dnr  TOTAL TIME TAKING CARE OF THIS PATIENT: 36 minutes.   POSSIBLE D/C IN 1-2 DAYS, DEPENDING ON CLINICAL CONDITION.  Note: This dictation was prepared with Dragon dictation along with smaller phrase technology. Any transcriptional errors that result from this process are unintentional.   Nicholes Mango M.D on 03/16/2016 at 2:56 PM  Between 7am to 6pm - Pager - [phone] After 6pm go to [email or web address] - password [email or web address] [facility]  Office  [phone]  CC: Primary care physician; Dion Body, MD

## 2016-03-16 NOTE — Care Management (Signed)
Admitted to [facility] with the diagnosis of acute renal failure. Lives alone. Friend is Mena Pauls [phone]). No home Health in the past. No skilled facility. No home oxygen. Uses no aids for ambulation. Last fall was last summer stepping out of the flower garden. Decreased appetite x 1 week. Prescriptions are filled at [facility] in [place] care of all basic and instrumental activities of daily living herself, doesn't drive. Uses a taxi, [employer], and friends help with errands. [employer], will transport. Shelbie Ammons RN MSN CCM Care Management [phone]

## 2016-03-16 NOTE — Care Management Important Message (Signed)
Important Message  Patient Details  Name: Shelly Phillips MRN: [id number] Date of Birth: 08/12/1932   Medicare Important Message Given:  Yes    Shelbie Ammons, RN 03/16/2016, 11:01 AM

## 2016-03-17 LAB — CBC
HCT: 36.7 % (ref 35.0–47.0)
Hemoglobin: 12.5 g/dL (ref 12.0–16.0)
MCH: 30.9 pg (ref 26.0–34.0)
MCHC: 34 g/dL (ref 32.0–36.0)
MCV: 90.7 fL (ref 80.0–100.0)
PLATELETS: 219 10*3/uL (ref 150–440)
RBC: 4.05 MIL/uL (ref 3.80–5.20)
RDW: 13.9 % (ref 11.5–14.5)
WBC: 12.2 10*3/uL — AB (ref 3.6–11.0)

## 2016-03-17 LAB — HEPATIC FUNCTION PANEL
ALT: 127 U/L — AB (ref 14–54)
AST: 126 U/L — ABNORMAL HIGH (ref 15–41)
Albumin: 2.4 g/dL — ABNORMAL LOW (ref 3.5–5.0)
Alkaline Phosphatase: 178 U/L — ABNORMAL HIGH (ref 38–126)
BILIRUBIN DIRECT: 0.1 mg/dL (ref 0.1–0.5)
BILIRUBIN INDIRECT: 0.4 mg/dL (ref 0.3–0.9)
TOTAL PROTEIN: 6.5 g/dL (ref 6.5–8.1)
Total Bilirubin: 0.5 mg/dL (ref 0.3–1.2)

## 2016-03-17 LAB — BASIC METABOLIC PANEL
Anion gap: 7 (ref 5–15)
BUN: 31 mg/dL — AB (ref 6–20)
CALCIUM: 8.4 mg/dL — AB (ref 8.9–10.3)
CO2: 23 mmol/L (ref 22–32)
CREATININE: 1.12 mg/dL — AB (ref 0.44–1.00)
Chloride: 109 mmol/L (ref 101–111)
GFR calc non Af Amer: 45 mL/min — ABNORMAL LOW (ref >60)
GFR, EST AFRICAN AMERICAN: 52 mL/min — AB (ref >60)
Glucose, Bld: 157 mg/dL — ABNORMAL HIGH (ref 65–99)
Potassium: 3.9 mmol/L (ref 3.5–5.1)
SODIUM: 139 mmol/L (ref 135–145)

## 2016-03-17 MED ORDER — CEPHALEXIN 500 MG PO CAPS: 500.0000 mg | ORAL_CAPSULE | Freq: Two times a day (BID) | ORAL | 0 refills | Status: AC

## 2016-03-17 MED ORDER — METOPROLOL TARTRATE 25 MG PO TABS: 25.0000 mg | ORAL_TABLET | Freq: Two times a day (BID) | ORAL | 0 refills | Status: AC

## 2016-03-17 MED ORDER — CEPHALEXIN 500 MG PO CAPS: 500.0000 mg | ORAL_CAPSULE | Freq: Two times a day (BID) | ORAL | 0 refills | Status: DC

## 2016-03-17 MED ORDER — DILTIAZEM HCL 60 MG PO TABS: 60.0000 mg | ORAL_TABLET | Freq: Three times a day (TID) | ORAL | 0 refills | Status: DC

## 2016-03-17 NOTE — Discharge Instructions (Signed)
Activity per PT recommendations continue home health PT Diet cardiac Follow-up with PCP in a week. PCP to follow up on the urine culture results which are pending and consider repeating CMP. Currently statin is on hold

## 2016-03-17 NOTE — Discharge Summary (Signed)
[facility] [place] NAME: Shelly Phillips    MR#:  [id number]  DATE OF BIRTH:  28-Jul-1932  DATE OF ADMISSION:  03/15/2016 ADMITTING PHYSICIAN: Vaughan Basta, MD  DATE OF DISCHARGE: 03/17/16 PRIMARY CARE PHYSICIAN: Dion Body, MD    ADMISSION DIAGNOSIS:  SOB (shortness of breath) [R06.02] UTI (lower urinary tract infection) [N39.0] Renal insufficiency [N28.9]  DISCHARGE DIAGNOSIS:  Principal Problem:   Acute renal failure (HCC) Active Problems:   UTI (urinary tract infection)   Acute renal failure (ARF) ([place])   SECONDARY DIAGNOSIS:   Past Medical History:  Diagnosis Date  . HLD (hyperlipidemia)   . Hypertension   . Obesity   . Pre-diabetes   . Rosacea   . Skin cancer     HOSPITAL COURSE:  * Acute renal failurePrerenal from poor by mouth intake Clinically improved with IV fluids Creatinine is improving. 3.11-1.93-1.12 almost at her baseline GFR is improving 15-27-45 Discharge patient home  * UTI Urine culture is sent from ER, still pending. PCP to follow up on the pending urine culture results Patient clinically improved with IV Rocephin. We will discharge patient with by mouth Keflex   * sinusitis   clinically feeling better She has completed outpatient course of azithromycin orally.  *Abnormal LFTs-could be acute phase reactants Labs are trending down, patient is asymptomatic Patient prefers going home. PCP to consider repeating LFTs including BMP in a week Abdominal ultrasound normal   * Hyperlipidemia hold atorvastatin in view of elevated LFTs until seen and cleared by primary care physician   * Hypertension  resume home medication, Lopressor 25 mg by mouth twice a day   Generalized weakness PT consult placed-recommending home health   All the records are reviewed and case discussed with RN Management plans discussed with the patient, she is in agreement.   DISCHARGE  CONDITIONS:   fair  CONSULTS OBTAINED:     PROCEDURES none  DRUG ALLERGIES:   Allergies  Allergen Reactions  . Demerol [Meperidine] Nausea And Vomiting    DISCHARGE MEDICATIONS:   Current Discharge Medication List    START taking these medications   Details  cephALEXin (KEFLEX) 500 MG capsule Take 1 capsule (500 mg total) by mouth 2 (two) times daily. Qty: 10 capsule, Refills: 0      CONTINUE these medications which have CHANGED   Details  metoprolol tartrate (LOPRESSOR) 25 MG tablet Take 1 tablet (25 mg total) by mouth 2 (two) times daily. Qty: 60 tablet, Refills: 0      CONTINUE these medications which have NOT CHANGED   Details  acetaminophen (TYLENOL) 500 MG tablet Take 1,000 mg by mouth at bedtime.    aspirin EC 81 MG tablet Take 1 tablet by mouth daily.    ibuprofen (ADVIL,MOTRIN) 200 MG tablet Take 400 mg by mouth 2 (two) times daily.    losartan-hydrochlorothiazide (HYZAAR) 100-12.5 MG tablet Take 1 tablet by mouth daily.    sodium chloride (OCEAN) 0.65 % SOLN nasal spray Place 2 sprays into both nostrils 2 (two) times daily.    triamcinolone (NASACORT ALLERGY 24HR) 55 MCG/ACT AERO nasal inhaler Place 1 spray into the nose 2 (two) times daily.      STOP taking these medications     atorvastatin (LIPITOR) 20 MG tablet      azithromycin (ZITHROMAX) 250 MG tablet          DISCHARGE INSTRUCTIONS:   Activity per PT recommendations continue home health PT Diet cardiac  Follow-up with PCP in a week. PCP to follow up on the urine culture results which are pending and consider repeating CMP. Currently statin is on hold    DIET:  Cardiac diet  DISCHARGE CONDITION:  Fair  ACTIVITY:  Activity as tolerated per PT  OXYGEN:  Home Oxygen: No.   Oxygen Delivery: room air  DISCHARGE LOCATION:  home   If you experience worsening of your admission symptoms, develop shortness of breath, life threatening emergency, suicidal or homicidal thoughts you  must seek medical attention immediately by calling 911 or calling your MD immediately  if symptoms less severe.  You Must read complete instructions/literature along with all the possible adverse reactions/side effects for all the Medicines you take and that have been prescribed to you. Take any new Medicines after you have completely understood and accpet all the possible adverse reactions/side effects.   Please note  You were cared for by a hospitalist during your hospital stay. If you have any questions about your discharge medications or the care you received while you were in the hospital after you are discharged, you can call the unit and asked to speak with the hospitalist on call if the hospitalist that took care of you is not available. Once you are discharged, your primary care physician will handle any further medical issues. Please note that NO REFILLS for any discharge medications will be authorized once you are discharged, as it is imperative that you return to your primary care physician (or establish a relationship with a primary care physician if you do not have one) for your aftercare needs so that they can reassess your need for medications and monitor your lab values.     Today  Chief Complaint  Patient presents with  . Shortness of Breath  . Weakness  . Cough   Patient is feeling better. Denies any abdominal pain or chest pain. Denies any dysuria. Prefers going home  ROS:  CONSTITUTIONAL: Denies fevers, chills. Denies any fatigue, weakness.  EYES: Denies blurry vision, double vision, eye pain. EARS, NOSE, THROAT: Denies tinnitus, ear pain, hearing loss. RESPIRATORY: Denies cough, wheeze, shortness of breath.  CARDIOVASCULAR: Denies chest pain, palpitations, edema.  GASTROINTESTINAL: Denies nausea, vomiting, diarrhea, abdominal pain. Denies bright red blood per rectum. GENITOURINARY: Denies dysuria, hematuria. ENDOCRINE: Denies nocturia or thyroid  problems. HEMATOLOGIC AND LYMPHATIC: Denies easy bruising or bleeding. SKIN: Denies rash or lesion. MUSCULOSKELETAL: Denies pain in neck, back, shoulder, knees, hips or arthritic symptoms.  NEUROLOGIC: Denies paralysis, paresthesias.  PSYCHIATRIC: Denies anxiety or depressive symptoms.   VITAL SIGNS:  Blood pressure 107/67, pulse 67, temperature 97.8 F (36.6 C), temperature source Oral, resp. rate 18, height 5\' 5"  (1.651 m), weight 98.4 kg (217 lb), SpO2 96 %.  I/O:    Intake/Output Summary (Last 24 hours) at 03/17/16 1216 Last data filed at 03/17/16 0949  Gross per 24 hour  Intake              771 ml  Output                0 ml  Net              771 ml    PHYSICAL EXAMINATION:  GENERAL:  82 y.o.-year-old patient lying in the bed with no acute distress.  EYES: Pupils equal, round, reactive to light and accommodation. No scleral icterus. Extraocular muscles intact.  HEENT: Head atraumatic, normocephalic. Oropharynx and nasopharynx clear.  NECK:  Supple, no jugular venous distention. No  thyroid enlargement, no tenderness.  LUNGS: Normal breath sounds bilaterally, no wheezing, rales,rhonchi or crepitation. No use of accessory muscles of respiration.  CARDIOVASCULAR: S1, S2 normal. No murmurs, rubs, or gallops.  ABDOMEN: Soft, non-tender, non-distended. Bowel sounds present. No organomegaly or mass.  EXTREMITIES: No pedal edema, cyanosis, or clubbing.  NEUROLOGIC: Cranial nerves II through XII are intact. Muscle strength 5/5 in all extremities. Sensation intact. Gait not checked.  PSYCHIATRIC: The patient is alert and oriented x 3.  SKIN: No obvious rash, lesion, or ulcer.   DATA REVIEW:   CBC  Recent Labs Lab 03/17/16 0407  WBC 12.2*  HGB 12.5  HCT 36.7  PLT 219    Chemistries   Recent Labs Lab 03/17/16 0407  NA 139  K 3.9  CL 109  CO2 23  GLUCOSE 157*  BUN 31*  CREATININE 1.12*  CALCIUM 8.4*  AST 126*  ALT 127*  ALKPHOS 178*  BILITOT 0.5    Cardiac  Enzymes  Recent Labs Lab 03/15/16 1503  TROPONINI 0.03*    Microbiology Results  No results found for this or any previous visit.  RADIOLOGY:  Dg Chest 2 View  Result Date: 03/15/2016 CLINICAL DATA:  Shortness of breath. EXAM: CHEST  2 VIEW COMPARISON:  CT chest 09/15/2015 FINDINGS: There is no focal parenchymal opacity. There is no pleural effusion or pneumothorax. The heart and mediastinal contours are unremarkable. The osseous structures are unremarkable. IMPRESSION: No active cardiopulmonary disease. Electronically Signed   By: Kathreen Devoid   On: 03/15/2016 14:53   US Abdomen Limited Ruq  Result Date: 03/15/2016 CLINICAL DATA:  Elevated LFTs. EXAM: US ABDOMEN LIMITED - RIGHT UPPER QUADRANT COMPARISON:  CT 09/15/2015. FINDINGS: Gallbladder: No gallstones or wall thickening visualized. No sonographic Murphy sign noted by sonographer. Common bile duct: Diameter: 5.4 mm Liver: No focal lesion identified. Within normal limits in parenchymal echogenicity. IMPRESSION: Negative exam. Electronically Signed   By: Marcello Moores  Register   On: 03/15/2016 17:07    EKG:   Orders placed or performed during the hospital encounter of 03/15/16  . EKG 12-Lead  . EKG 12-Lead      Management plans discussed with the patient, family and they are in agreement.  CODE STATUS:     Code Status Orders        Start     Ordered   03/15/16 1907  Do not attempt resuscitation (DNR)  Continuous    Question Answer Comment  In the event of cardiac or respiratory ARREST Do not call a "code blue"   In the event of cardiac or respiratory ARREST Do not perform Intubation, CPR, defibrillation or ACLS   In the event of cardiac or respiratory ARREST Use medication by any route, position, wound care, and other measures to relive pain and suffering. May use oxygen, suction and manual treatment of airway obstruction as needed for comfort.   Comments pt confirms.      03/15/16 1906    Code Status History    Date  Active Date Inactive Code Status Order ID Comments User Context   09/09/2015  6:07 PM 09/10/2015  8:48 PM Full Code [id number]  Shelly Mango, MD Inpatient    Advance Directive Documentation   Flowsheet Row Most Recent Value  Type of Advance Directive  Healthcare Power of [place] Plate is [place], lives in [place]  Pre-existing out of facility DNR order (yellow form or pink MOST form)  No data  "MOST" Form in Place?  No data  TOTAL TIME TAKING CARE OF THIS PATIENT: 45minutes.   Note: This dictation was prepared with Dragon dictation along with smaller phrase technology. Any transcriptional errors that result from this process are unintentional.   @M[facility] @  on 03/17/2016 at 12:16 PM  Between 7am to 6pm - Pager - [phone]  After 6pm go to [email or web address] - password [email or web address] [facility]  Office  [phone]  CC: Primary care physician; Dion Body, MD

## 2016-03-17 NOTE — Progress Notes (Signed)
Patient discharged home with home health. Prescriptions given to patient. All discharge instructions given to patient and all questions answered. Taxi called for patient.

## 2016-03-17 NOTE — Care Management (Signed)
Discharge to home today per Dr. Margaretmary Eddy. Discussed home health agencies. [place], Edward Qualia RN , [facility] representative updated.  Friend will transport. Shelbie Ammons RN MSN CCM Care Management [phone]

## 2016-03-18 LAB — URINE CULTURE

## 2016-11-06 ENCOUNTER — Emergency Department: Payer: Medicare Other

## 2016-11-06 ENCOUNTER — Encounter: Payer: Self-pay | Admitting: Emergency Medicine

## 2016-11-06 ENCOUNTER — Emergency Department
Admission: EM | Admit: 2016-11-06 | Discharge: 2016-11-06 | Disposition: A | Discharge status: Home / Self Care | Payer: Medicare Other | Attending: Emergency Medicine | Admitting: Emergency Medicine

## 2016-11-06 DIAGNOSIS — N12 Tubulo-interstitial nephritis, not specified as acute or chronic: Secondary | ICD-10-CM | POA: Diagnosis not present

## 2016-11-06 DIAGNOSIS — I10 Essential (primary) hypertension: Secondary | ICD-10-CM | POA: Insufficient documentation

## 2016-11-06 DIAGNOSIS — Z7982 Long term (current) use of aspirin: Secondary | ICD-10-CM | POA: Insufficient documentation

## 2016-11-06 DIAGNOSIS — Z79899 Other long term (current) drug therapy: Secondary | ICD-10-CM | POA: Insufficient documentation

## 2016-11-06 DIAGNOSIS — R300 Dysuria: Secondary | ICD-10-CM | POA: Diagnosis present

## 2016-11-06 LAB — URINALYSIS, COMPLETE (UACMP) WITH MICROSCOPIC
Bilirubin Urine: NEGATIVE
Glucose, UA: 50 mg/dL — AB
Ketones, ur: NEGATIVE mg/dL
NITRITE: NEGATIVE
PH: 5 (ref 5.0–8.0)
Protein, ur: 100 mg/dL — AB
Specific Gravity, Urine: 1.017 (ref 1.005–1.030)

## 2016-11-06 LAB — HEPATIC FUNCTION PANEL
ALK PHOS: 89 U/L (ref 38–126)
ALT: 13 U/L — AB (ref 14–54)
AST: 15 U/L (ref 15–41)
Albumin: 3.3 g/dL — ABNORMAL LOW (ref 3.5–5.0)
BILIRUBIN DIRECT: 0.2 mg/dL (ref 0.1–0.5)
BILIRUBIN TOTAL: 1.3 mg/dL — AB (ref 0.3–1.2)
Indirect Bilirubin: 1.1 mg/dL — ABNORMAL HIGH (ref 0.3–0.9)
Total Protein: 6.9 g/dL (ref 6.5–8.1)

## 2016-11-06 LAB — CBC WITH DIFFERENTIAL/PLATELET
Basophils Absolute: 0.1 10*3/uL (ref 0–0.1)
Basophils Relative: 0 %
EOS ABS: 0 10*3/uL (ref 0–0.7)
EOS PCT: 0 %
HCT: 36.3 % (ref 35.0–47.0)
Hemoglobin: 12.1 g/dL (ref 12.0–16.0)
LYMPHS ABS: 1.3 10*3/uL (ref 1.0–3.6)
Lymphocytes Relative: 7 %
MCH: 30 pg (ref 26.0–34.0)
MCHC: 33.3 g/dL (ref 32.0–36.0)
MCV: 90.1 fL (ref 80.0–100.0)
Monocytes Absolute: 2 10*3/uL — ABNORMAL HIGH (ref 0.2–0.9)
Monocytes Relative: 11 %
Neutro Abs: 14.9 10*3/uL — ABNORMAL HIGH (ref 1.4–6.5)
Neutrophils Relative %: 82 %
PLATELETS: 168 10*3/uL (ref 150–440)
RBC: 4.04 MIL/uL (ref 3.80–5.20)
RDW: 14.4 % (ref 11.5–14.5)
WBC: 18.3 10*3/uL — AB (ref 3.6–11.0)

## 2016-11-06 LAB — BASIC METABOLIC PANEL
Anion gap: 10 (ref 5–15)
BUN: 13 mg/dL (ref 6–20)
CALCIUM: 8.4 mg/dL — AB (ref 8.9–10.3)
CHLORIDE: 101 mmol/L (ref 101–111)
CO2: 22 mmol/L (ref 22–32)
Creatinine, Ser: 1.25 mg/dL — ABNORMAL HIGH (ref 0.44–1.00)
GFR calc non Af Amer: 39 mL/min — ABNORMAL LOW (ref >60)
GFR, EST AFRICAN AMERICAN: 45 mL/min — AB (ref >60)
GLUCOSE: 148 mg/dL — AB (ref 65–99)
POTASSIUM: 2.9 mmol/L — AB (ref 3.5–5.1)
Sodium: 133 mmol/L — ABNORMAL LOW (ref 135–145)

## 2016-11-06 LAB — TROPONIN I: TROPONIN I: 0.03 ng/mL — AB (ref <0.03)

## 2016-11-06 LAB — LACTIC ACID, PLASMA: LACTIC ACID, VENOUS: 0.9 mmol/L (ref 0.5–1.9)

## 2016-11-06 LAB — URINE DRUG SCREEN, QUALITATIVE (ARMC ONLY)
AMPHETAMINES, UR SCREEN: NOT DETECTED
Barbiturates, Ur Screen: NOT DETECTED
Benzodiazepine, Ur Scrn: NOT DETECTED
CANNABINOID 50 NG, UR ~~LOC~~: NOT DETECTED
COCAINE METABOLITE, UR ~~LOC~~: NOT DETECTED
MDMA (ECSTASY) UR SCREEN: NOT DETECTED
Methadone Scn, Ur: NOT DETECTED
OPIATE, UR SCREEN: NOT DETECTED
PHENCYCLIDINE (PCP) UR S: NOT DETECTED
Tricyclic, Ur Screen: NOT DETECTED

## 2016-11-06 MED ORDER — SODIUM CHLORIDE 0.9 % IV BOLUS (SEPSIS)
1000.0000 mL | Freq: Once | INTRAVENOUS | Status: AC
  Administered 2016-11-06: 1000 mL via INTRAVENOUS

## 2016-11-06 MED ORDER — CEFTRIAXONE SODIUM-DEXTROSE 1-3.74 GM-% IV SOLR
1.0000 g | Freq: Once | INTRAVENOUS | Status: AC
  Administered 2016-11-06: 1 g via INTRAVENOUS
  Filled 2016-11-06: qty 50

## 2016-11-06 MED ORDER — CEFTRIAXONE SODIUM 1 G IJ SOLR: 1.0000 g | Freq: Once | INTRAMUSCULAR | Status: DC

## 2016-11-06 MED ORDER — IOPAMIDOL (ISOVUE-300) INJECTION 61%: 75.0000 mL | Freq: Once | INTRAVENOUS | Status: DC | PRN

## 2016-11-06 MED ORDER — CEPHALEXIN 500 MG PO CAPS: 500.0000 mg | ORAL_CAPSULE | Freq: Four times a day (QID) | ORAL | 0 refills | Status: AC

## 2016-11-06 MED ORDER — ONDANSETRON HCL 4 MG/2ML IJ SOLN
4.0000 mg | Freq: Once | INTRAMUSCULAR | Status: AC
  Administered 2016-11-06: 4 mg via INTRAVENOUS
  Filled 2016-11-06: qty 2

## 2016-11-06 MED ORDER — MORPHINE SULFATE (PF) 4 MG/ML IV SOLN
4.0000 mg | Freq: Once | INTRAVENOUS | Status: AC
Start: 2016-11-06 — End: 2016-11-06
  Administered 2016-11-06: 4 mg via INTRAVENOUS
  Filled 2016-11-06: qty 1

## 2016-11-06 NOTE — ED Triage Notes (Signed)
Pt arrived via ems from home. Pt reports frequent UTI's. Pt reports last UTI one month ago. Pt reported fever started 3 days ago but today she "couldn't keep water down and just couldn't let something like that go."

## 2016-11-06 NOTE — ED Provider Notes (Signed)
[facility] Emergency Department Provider Note  ____________________________________________   First MD Initiated Contact with Patient 11/06/16 1635     (approximate)  I have reviewed the triage vital signs and the nursing notes.   HISTORY  Chief Complaint Fever and Recurrent UTI    HPI Shelly Phillips is a 83 y.o. female who comes to the emergency department via EMS with several days of shaking chills at home dysuria frequency and hesitancy. She has a past medical history of multiple recurrent urinary tract infections. She has recently started wearing a diaper secondary to urinary incontinence. She recently completed a course of antibiotics but does not know what they were. On chart review her primary care physician had given her an intramuscular shot of ceftriaxone as well as discharge her home on Ceftin.   Past Medical History:  Diagnosis Date  . HLD (hyperlipidemia)   . Hypertension   . Obesity   . Pre-diabetes   . Rosacea   . Skin cancer     Patient Active Problem List   Diagnosis Date Noted  . Acute renal failure ([place]) 03/15/2016  . UTI (urinary tract infection) 03/15/2016  . Acute renal failure (ARF) ([place]) 03/15/2016  . Unstable angina ([place]) 09/10/2015  . Essential hypertension 09/10/2015  . Hyperlipidemia 09/10/2015  . Morbid obesity ([place]) 09/10/2015  . Pre-diabetes 09/10/2015  . Pleuritic chest pain 09/10/2015  . Elevated troponin 09/10/2015  . Renal insufficiency 09/10/2015  . Leukocytosis 09/10/2015  . Chest pain 09/09/2015    Past Surgical History:  Procedure Laterality Date  . FACIAL RECONSTRUCTION SURGERY    . FRACTURE SURGERY      Prior to Admission medications   Medication Sig Start Date End Date Taking? Authorizing Provider  acetaminophen (TYLENOL) 500 MG tablet Take 1,000 mg by mouth at bedtime.    Historical Provider, MD  aspirin EC 81 MG tablet Take 1 tablet by mouth daily. 02/27/15   Historical Provider, MD    ibuprofen (ADVIL,MOTRIN) 200 MG tablet Take 400 mg by mouth 2 (two) times daily.    Historical Provider, MD  losartan-hydrochlorothiazide (HYZAAR) 100-12.5 MG tablet Take 1 tablet by mouth daily.    Historical Provider, MD  metoprolol tartrate (LOPRESSOR) 25 MG tablet Take 1 tablet (25 mg total) by mouth 2 (two) times daily. 03/17/16   Nicholes Mango, MD  sodium chloride (OCEAN) 0.65 % SOLN nasal spray Place 2 sprays into both nostrils 2 (two) times daily.    Historical Provider, MD  triamcinolone (NASACORT ALLERGY 24HR) 55 MCG/ACT AERO nasal inhaler Place 1 spray into the nose 2 (two) times daily.    Historical Provider, MD    Allergies Demerol [meperidine]  Family History  Problem Relation Age of Onset  . CAD Father   . Cancer Brother   . Parkinson's disease Mother     Social History Social History  Substance Use Topics  . Smoking status: Never Smoker  . Smokeless tobacco: Never Used  . Alcohol use No    Review of Systems Constitutional: Positive fevers and chills Eyes: No visual changes. ENT: No sore throat. Cardiovascular: Denies chest pain. Respiratory: Denies shortness of breath. Gastrointestinal: Positive abdominal pain.  Positive nausea, no vomiting.  No diarrhea.  No constipation. Genitourinary: Positive for dysuria. Musculoskeletal: Negative for back pain. Skin: Negative for rash. Neurological: Negative for headaches, focal weakness or numbness.  10-point ROS otherwise negative.  ____________________________________________   PHYSICAL EXAM:  VITAL SIGNS: ED Triage Vitals  Enc Vitals Group  BP 11/06/16 1633 128/79     Pulse Rate 11/06/16 1633 91     Resp 11/06/16 1633 20     Temp 11/06/16 1633 98.9 F (37.2 C)     Temp Source 11/06/16 1633 Oral     SpO2 11/06/16 1633 92 %     Weight 11/06/16 1635 221 lb 11.2 oz (100.6 kg)     Height 11/06/16 1635 5\' 8"  (1.727 m)     Head Circumference --      Peak Flow --      Pain Score 11/06/16 1634 3     Pain  Loc --      Pain Edu? --      Excl. in [place]? --     Constitutional: Alert and oriented x 4 well appearing nontoxic no diaphoresis speaks in full, clear sentences Eyes: PERRL EOMI. Head: Atraumatic. Nose: No congestion/rhinnorhea. Mouth/Throat: No trismus Neck: No stridor.   Cardiovascular: Normal rate, regular rhythm. Grossly normal heart sounds.  Good peripheral circulation. Respiratory: Normal respiratory effort.  No retractions. Lungs CTAB and moving good air Gastrointestinal: Soft nondistended Quite tender in diffuse lower abdomen with no rebound no guarding and no peritonitis. No costovertebral tenderness Musculoskeletal: No lower extremity edema   Neurologic:  Normal speech and language. No gross focal neurologic deficits are appreciated. Skin:  Skin is warm, dry and intact. No rash noted. Psychiatric: Mood and affect are normal. Speech and behavior are normal.    ____________________________________________   DIFFERENTIAL  [place], urinary tract infection, sepsis, appendicitis, diverticulitis ____________________________________________   LABS (all labs ordered are listed, but only abnormal results are displayed)  Labs Reviewed  BASIC METABOLIC PANEL - Abnormal; Notable for the following:       Result Value   Sodium 133 (*)    Potassium 2.9 (*)    Glucose, Bld 148 (*)    Creatinine, Ser 1.25 (*)    Calcium 8.4 (*)    GFR calc non Af Amer 39 (*)    GFR calc Af Amer 45 (*)    All other components within normal limits  HEPATIC FUNCTION PANEL - Abnormal; Notable for the following:    Albumin 3.3 (*)    ALT 13 (*)    Total Bilirubin 1.3 (*)    Indirect Bilirubin 1.1 (*)    All other components within normal limits  TROPONIN I - Abnormal; Notable for the following:    Troponin I 0.03 (*)    All other components within normal limits  CBC WITH DIFFERENTIAL/PLATELET - Abnormal; Notable for the following:    WBC 18.3 (*)    Neutro Abs 14.9 (*)    Monocytes  Absolute 2.0 (*)    All other components within normal limits  URINALYSIS, COMPLETE (UACMP) WITH MICROSCOPIC - Abnormal; Notable for the following:    Color, Urine AMBER (*)    APPearance CLOUDY (*)    Glucose, UA 50 (*)    Hgb urine dipstick SMALL (*)    Protein, ur 100 (*)    Leukocytes, UA LARGE (*)    Bacteria, UA RARE (*)    Squamous Epithelial / LPF 0-5 (*)    All other components within normal limits  LACTIC ACID, PLASMA  URINE DRUG SCREEN, QUALITATIVE ([facility] ONLY)  LACTIC ACID, PLASMA    Urinalysis is consistent with infection  EKG   ____________________________________________  RADIOLOGY   ____________________________________________   PROCEDURES  Procedure(s) performed: no  Procedures  Critical Care performed: no  ____________________________________________   INITIAL  IMPRESSION / ASSESSMENT AND PLAN / ED COURSE  Pertinent labs & imaging results that were available during my care of the patient were reviewed by me and considered in my medical decision making (see chart for details).  The patient arrives with subjective fever but afebrile here. She has no costovertebral tenderness but some abdominal discomfort. An in out urinalysis confirms infection which along with a reported fever is concerning for early pyelonephritis. I initially recommended a CT scan to the patient however she declined which I think is reasonable. She was given a gram of ceftriaxone based on previous cultures and observed for 2 hours. She tolerated the ceftriaxone well without hypotension and she'll be discharged home with Keflex and primary care follow-up. Strict return precautions given.      ____________________________________________   FINAL CLINICAL IMPRESSION(S) / ED DIAGNOSES  Final diagnoses:  Pyelonephritis  Elevated troponin      NEW MEDICATIONS STARTED DURING THIS VISIT:  New Prescriptions   No medications on file     Note:  This document was prepared  using Dragon voice recognition software and may include unintentional dictation errors.     Darel Hong, MD 11/06/16 2056

## 2016-11-06 NOTE — Discharge Instructions (Signed)
Please take all of your antibiotics as prescribed and follow up with her primary care physician in 2 days for a recheck. Return to the emergency department for any concerns such as worsening fever, chills, if you cannot eat or drink, or for any other concerns whatsoever.  It was a pleasure to take care of you today, and thank you for coming to our emergency department.  If you have any questions or concerns before leaving please ask the nurse to grab me and I'm more than happy to go through your aftercare instructions again.  If you were prescribed any opioid pain medication today such as Norco, Vicodin, Percocet, morphine, hydrocodone, or oxycodone please make sure you do not drive when you are taking this medication as it can alter your ability to drive safely.  If you have any concerns once you are home that you are not improving or are in fact getting worse before you can make it to your follow-up appointment, please do not hesitate to call 911 and come back for further evaluation.  Darel Hong MD  Results for orders placed or performed during the hospital encounter of 67/20/94  Basic metabolic panel  Result Value Ref Range   Sodium 133 (L) 135 - 145 mmol/L   Potassium 2.9 (L) 3.5 - 5.1 mmol/L   Chloride 101 101 - 111 mmol/L   CO2 22 22 - 32 mmol/L   Glucose, Bld 148 (H) 65 - 99 mg/dL   BUN 13 6 - 20 mg/dL   Creatinine, Ser 1.25 (H) 0.44 - 1.00 mg/dL   Calcium 8.4 (L) 8.9 - 10.3 mg/dL   GFR calc non Af Amer 39 (L) >60 mL/min   GFR calc Af Amer 45 (L) >60 mL/min   Anion gap 10 5 - 15  Hepatic function panel  Result Value Ref Range   Total Protein 6.9 6.5 - 8.1 g/dL   Albumin 3.3 (L) 3.5 - 5.0 g/dL   AST 15 15 - 41 U/L   ALT 13 (L) 14 - 54 U/L   Alkaline Phosphatase 89 38 - 126 U/L   Total Bilirubin 1.3 (H) 0.3 - 1.2 mg/dL   Bilirubin, Direct 0.2 0.1 - 0.5 mg/dL   Indirect Bilirubin 1.1 (H) 0.3 - 0.9 mg/dL  Troponin I  Result Value Ref Range   Troponin I 0.03 (HH) <0.03 ng/mL   CBC with Differential  Result Value Ref Range   WBC 18.3 (H) 3.6 - 11.0 K/uL   RBC 4.04 3.80 - 5.20 MIL/uL   Hemoglobin 12.1 12.0 - 16.0 g/dL   HCT 36.3 35.0 - 47.0 %   MCV 90.1 80.0 - 100.0 fL   MCH 30.0 26.0 - 34.0 pg   MCHC 33.3 32.0 - 36.0 g/dL   RDW 14.4 11.5 - 14.5 %   Platelets 168 150 - 440 K/uL   Neutrophils Relative % 82 %   Neutro Abs 14.9 (H) 1.4 - 6.5 K/uL   Lymphocytes Relative 7 %   Lymphs Abs 1.3 1.0 - 3.6 K/uL   Monocytes Relative 11 %   Monocytes Absolute 2.0 (H) 0.2 - 0.9 K/uL   Eosinophils Relative 0 %   Eosinophils Absolute 0.0 0 - 0.7 K/uL   Basophils Relative 0 %   Basophils Absolute 0.1 0 - 0.1 K/uL  Urinalysis, Complete w Microscopic  Result Value Ref Range   Color, Urine AMBER (A) YELLOW   APPearance CLOUDY (A) CLEAR   Specific Gravity, Urine 1.017 1.005 - 1.030   pH  5.0 5.0 - 8.0   Glucose, UA 50 (A) NEGATIVE mg/dL   Hgb urine dipstick SMALL (A) NEGATIVE   Bilirubin Urine NEGATIVE NEGATIVE   Ketones, ur NEGATIVE NEGATIVE mg/dL   Protein, ur 100 (A) NEGATIVE mg/dL   Nitrite NEGATIVE NEGATIVE   Leukocytes, UA LARGE (A) NEGATIVE   RBC / HPF TOO NUMEROUS TO COUNT 0 - 5 RBC/hpf   WBC, UA TOO NUMEROUS TO COUNT 0 - 5 WBC/hpf   Bacteria, UA RARE (A) NONE SEEN   Squamous Epithelial / LPF 0-5 (A) NONE SEEN   WBC Clumps PRESENT    Mucous PRESENT   Urine Drug Screen, Qualitative  Result Value Ref Range   Tricyclic, Ur Screen NONE DETECTED NONE DETECTED   Amphetamines, Ur Screen NONE DETECTED NONE DETECTED   MDMA (Ecstasy)Ur Screen NONE DETECTED NONE DETECTED   Cocaine Metabolite,Ur [place] NONE DETECTED NONE DETECTED   Opiate, Ur Screen NONE DETECTED NONE DETECTED   Phencyclidine (PCP) Ur S NONE DETECTED NONE DETECTED   Cannabinoid 50 Ng, Ur [place] NONE DETECTED NONE DETECTED   Barbiturates, Ur Screen NONE DETECTED NONE DETECTED   Benzodiazepine, Ur Scrn NONE DETECTED NONE DETECTED   Methadone Scn, Ur NONE DETECTED NONE DETECTED  Lactic acid, plasma    Result Value Ref Range   Lactic Acid, Venous 0.9 0.5 - 1.9 mmol/L

## 2016-11-06 NOTE — ED Notes (Signed)
Pt refuses CT

## 2016-11-29 ENCOUNTER — Ambulatory Visit: Payer: Medicare Other | Admitting: Certified Registered Nurse Anesthetist

## 2016-11-29 ENCOUNTER — Encounter
Admission: RE | Disposition: A | Discharge status: Home / Self Care | Payer: Self-pay | Source: Ambulatory Visit | Attending: Ophthalmology

## 2016-11-29 ENCOUNTER — Ambulatory Visit
Admission: RE | Admit: 2016-11-29 | Discharge: 2016-11-29 | Disposition: A | Discharge status: Home / Self Care | Payer: Medicare Other | Source: Ambulatory Visit | Attending: Ophthalmology | Admitting: Ophthalmology

## 2016-11-29 ENCOUNTER — Encounter: Payer: Self-pay | Admitting: *Deleted

## 2016-11-29 DIAGNOSIS — N289 Disorder of kidney and ureter, unspecified: Secondary | ICD-10-CM | POA: Diagnosis not present

## 2016-11-29 DIAGNOSIS — Z955 Presence of coronary angioplasty implant and graft: Secondary | ICD-10-CM | POA: Insufficient documentation

## 2016-11-29 DIAGNOSIS — Z87891 Personal history of nicotine dependence: Secondary | ICD-10-CM | POA: Diagnosis not present

## 2016-11-29 DIAGNOSIS — E785 Hyperlipidemia, unspecified: Secondary | ICD-10-CM | POA: Diagnosis not present

## 2016-11-29 DIAGNOSIS — I10 Essential (primary) hypertension: Secondary | ICD-10-CM | POA: Insufficient documentation

## 2016-11-29 DIAGNOSIS — E669 Obesity, unspecified: Secondary | ICD-10-CM | POA: Diagnosis not present

## 2016-11-29 DIAGNOSIS — Z85828 Personal history of other malignant neoplasm of skin: Secondary | ICD-10-CM | POA: Insufficient documentation

## 2016-11-29 DIAGNOSIS — E1136 Type 2 diabetes mellitus with diabetic cataract: Secondary | ICD-10-CM | POA: Diagnosis not present

## 2016-11-29 DIAGNOSIS — Z6832 Body mass index (BMI) 32.0-32.9, adult: Secondary | ICD-10-CM | POA: Diagnosis not present

## 2016-11-29 HISTORY — DX: Urinary tract infection, site not specified: N39.0

## 2016-11-29 HISTORY — PX: CATARACT EXTRACTION W/PHACO: SHX586

## 2016-11-29 SURGERY — PHACOEMULSIFICATION, CATARACT, WITH IOL INSERTION
Anesthesia: Monitor Anesthesia Care | Site: Eye | Laterality: Right | Wound class: Clean

## 2016-11-29 MED ORDER — CARBACHOL 0.01 % IO SOLN
INTRAOCULAR | Status: DC | PRN
  Administered 2016-11-29: 0.5 mL via INTRAOCULAR

## 2016-11-29 MED ORDER — LIDOCAINE HCL (PF) 4 % IJ SOLN
INTRAOCULAR | Status: DC | PRN
  Administered 2016-11-29: 4 mL via OPHTHALMIC

## 2016-11-29 MED ORDER — MOXIFLOXACIN HCL 0.5 % OP SOLN: 1.0000 [drp] | OPHTHALMIC | Status: DC | PRN

## 2016-11-29 MED ORDER — SODIUM CHLORIDE 0.9 % IV SOLN
INTRAVENOUS | Status: DC
  Administered 2016-11-29 (×2): via INTRAVENOUS

## 2016-11-29 MED ORDER — EPINEPHRINE PF 1 MG/ML IJ SOLN
INTRAMUSCULAR | Status: AC
  Filled 2016-11-29: qty 2

## 2016-11-29 MED ORDER — POVIDONE-IODINE 5 % OP SOLN
OPHTHALMIC | Status: AC
  Filled 2016-11-29: qty 30

## 2016-11-29 MED ORDER — MOXIFLOXACIN HCL 0.5 % OP SOLN
OPHTHALMIC | Status: DC | PRN
  Administered 2016-11-29: 0.2 mL via OPHTHALMIC

## 2016-11-29 MED ORDER — MOXIFLOXACIN HCL 0.5 % OP SOLN
OPHTHALMIC | Status: AC
  Filled 2016-11-29: qty 3

## 2016-11-29 MED ORDER — ARMC OPHTHALMIC DILATING DROPS
1.0000 "application " | OPHTHALMIC | Status: AC
  Administered 2016-11-29 (×3): 1 via OPHTHALMIC

## 2016-11-29 MED ORDER — NA CHONDROIT SULF-NA HYALURON 40-17 MG/ML IO SOLN
INTRAOCULAR | Status: AC
  Filled 2016-11-29: qty 1

## 2016-11-29 MED ORDER — EPINEPHRINE PF 1 MG/ML IJ SOLN
INTRAOCULAR | Status: DC | PRN
  Administered 2016-11-29: 08:00:00 via OPHTHALMIC

## 2016-11-29 MED ORDER — FENTANYL CITRATE (PF) 100 MCG/2ML IJ SOLN
INTRAMUSCULAR | Status: DC | PRN
  Administered 2016-11-29: 25 ug via INTRAVENOUS
  Administered 2016-11-29: 50 ug via INTRAVENOUS

## 2016-11-29 MED ORDER — POVIDONE-IODINE 5 % OP SOLN
OPHTHALMIC | Status: DC | PRN
  Administered 2016-11-29: 1 via OPHTHALMIC

## 2016-11-29 MED ORDER — LIDOCAINE HCL (PF) 2 % IJ SOLN
INTRAMUSCULAR | Status: AC
  Filled 2016-11-29: qty 2

## 2016-11-29 MED ORDER — FENTANYL CITRATE (PF) 100 MCG/2ML IJ SOLN
INTRAMUSCULAR | Status: AC
  Filled 2016-11-29: qty 2

## 2016-11-29 MED ORDER — NA CHONDROIT SULF-NA HYALURON 40-17 MG/ML IO SOLN
INTRAOCULAR | Status: DC | PRN
  Administered 2016-11-29: 1 mL via INTRAOCULAR

## 2016-11-29 MED ORDER — ARMC OPHTHALMIC DILATING DROPS
OPHTHALMIC | Status: AC
  Filled 2016-11-29: qty 0.4

## 2016-11-29 SURGICAL SUPPLY — 14 items
GLOVE BIO SURGEON STRL SZ8 (GLOVE) ×3 IMPLANT
GLOVE BIOGEL M 6.5 STRL (GLOVE) ×3 IMPLANT
GLOVE SURG LX 8.0 MICRO (GLOVE) ×2
GLOVE SURG LX STRL 8.0 MICRO (GLOVE) ×1 IMPLANT
GOWN STRL REUS W/ TWL LRG LVL3 (GOWN DISPOSABLE) ×2 IMPLANT
GOWN STRL REUS W/TWL LRG LVL3 (GOWN DISPOSABLE) ×4
LENS IOL TECNIS ITEC 22.5 (Intraocular Lens) ×3 IMPLANT
PACK CATARACT (MISCELLANEOUS) ×3 IMPLANT
PACK CATARACT BRASINGTON LX (MISCELLANEOUS) ×3 IMPLANT
SOL BSS BAG (MISCELLANEOUS) ×3
SOLUTION BSS BAG (MISCELLANEOUS) ×1 IMPLANT
SYR 5ML LL (SYRINGE) ×3 IMPLANT
WATER STERILE IRR 250ML POUR (IV SOLUTION) ×3 IMPLANT
WIPE NON LINTING 3.25X3.25 (MISCELLANEOUS) ×3 IMPLANT

## 2016-11-29 NOTE — Anesthesia Postprocedure Evaluation (Signed)
Anesthesia Post Note  Patient: Shelly Phillips  Procedure(s) Performed: Procedure(s) (LRB): CATARACT EXTRACTION PHACO AND INTRAOCULAR LENS PLACEMENT (IOC) (Right)  Patient location during evaluation: PACU Anesthesia Type: MAC Level of consciousness: awake and alert and oriented Pain management: pain level controlled Vital Signs Assessment: post-procedure vital signs reviewed and stable Respiratory status: respiratory function stable Cardiovascular status: stable Anesthetic complications: no     Last Vitals:  Vitals:   11/29/16 0626  BP: (!) 157/110  Pulse: 79  Resp: 16  Temp: 36.3 C    Last Pain:  Vitals:   11/29/16 0626  TempSrc: Tympanic                 [name]

## 2016-11-29 NOTE — Anesthesia Post-op Follow-up Note (Cosign Needed)
Anesthesia QCDR form completed.        

## 2016-11-29 NOTE — Transfer of Care (Signed)
Immediate Anesthesia Transfer of Care Note  Patient: Shelly Phillips  Procedure(s) Performed: Procedure(s) with comments: CATARACT EXTRACTION PHACO AND INTRAOCULAR LENS PLACEMENT (IOC) (Right) - [place] 49.8 AP% 21.9 CDE 10.89 Fluid Pack Lot # [id number]  Patient Location: PACU  Anesthesia Type:MAC  Level of Consciousness: awake, alert  and oriented  Airway & Oxygen Therapy: Patient Spontanous Breathing  Post-op Assessment: Report given to RN and Post -op Vital signs reviewed and stable  Post vital signs: Reviewed and stable  Last Vitals:  Vitals:   11/29/16 0626  BP: (!) 157/110  Pulse: 79  Resp: 16  Temp: 36.3 C    Last Pain:  Vitals:   11/29/16 0626  TempSrc: Tympanic         Complications: No apparent anesthesia complications

## 2016-11-29 NOTE — Op Note (Signed)
PREOPERATIVE DIAGNOSIS:  Nuclear sclerotic cataract of the right eye.   POSTOPERATIVE DIAGNOSIS:  NUCLEAR SCLEROTIC CATARACT RIGHT EYE   OPERATIVE PROCEDURE: Procedure(s): CATARACT EXTRACTION PHACO AND INTRAOCULAR LENS PLACEMENT (IOC)   SURGEON:  Birder Robson, MD.   ANESTHESIA:  Anesthesiologist: Martha Clan, MD CRNA: Demetrius Charity, CRNA  1.      Managed anesthesia care. 2.      0.94ml of Shugarcaine was instilled in the eye following the paracentesis.   COMPLICATIONS:  None.   TECHNIQUE:   Stop and chop   DESCRIPTION OF PROCEDURE:  The patient was examined and consented in the preoperative holding area where the aforementioned topical anesthesia was applied to the right eye and then brought back to the Operating Room where the right eye was prepped and draped in the usual sterile ophthalmic fashion and a lid speculum was placed. A paracentesis was created with the side port blade and the anterior chamber was filled with viscoelastic. A near clear corneal incision was performed with the steel keratome. A continuous curvilinear capsulorrhexis was performed with a cystotome followed by the capsulorrhexis forceps. Hydrodissection and hydrodelineation were carried out with BSS on a blunt cannula. The lens was removed in a stop and chop  technique and the remaining cortical material was removed with the irrigation-aspiration handpiece. The capsular bag was inflated with viscoelastic and the Technis [id number]  lens was placed in the capsular bag without complication. The remaining viscoelastic was removed from the eye with the irrigation-aspiration handpiece. The wounds were hydrated. The anterior chamber was flushed with Miostat and the eye was inflated to physiologic pressure. 0.74ml of Vigamox was placed in the anterior chamber. The wounds were found to be water tight. The eye was dressed with Vigamox. The patient was given protective glasses to wear throughout the day and a shield with which  to sleep tonight. The patient was also given drops with which to begin a drop regimen today and will follow-up with me in one day.  Implant Name Type Inv. Item Serial No. Manufacturer Lot No. LRB No. Used  LENS IOL DIOP 22.5 - [id number] Intraocular Lens LENS IOL DIOP 22.5 [id number] AMO   Right 1   Procedure(s) with comments: CATARACT EXTRACTION PHACO AND INTRAOCULAR LENS PLACEMENT (IOC) (Right) - [place] 49.8 AP% 21.9 CDE 10.89 Fluid Pack Lot # [id number]  Electronically signed: [place] 11/29/2016 8:32 AM

## 2016-11-29 NOTE — H&P (Signed)
All labs reviewed. Abnormal studies sent to patients PCP when indicated.  Previous H&P reviewed, patient examined, there are NO CHANGES.  Maanav Kassabian LOUIS5/15/20188:02 AM

## 2016-11-29 NOTE — Anesthesia Preprocedure Evaluation (Signed)
Anesthesia Evaluation  Patient identified by MRN, date of birth, ID band Patient awake    Reviewed: Allergy & Precautions, H&P , NPO status , Patient's Chart, lab work & pertinent test results, reviewed documented beta blocker date and time   History of Anesthesia Complications Negative for: history of anesthetic complications  Airway Mallampati: II  TM Distance: >3 FB Neck ROM: full    Dental  (+) Dental Advidsory Given, Upper Dentures, Lower Dentures   Pulmonary neg pulmonary ROS, former smoker,           Cardiovascular Exercise Tolerance: Good hypertension, (-) angina(-) CAD, (-) Past MI, (-) Cardiac Stents and (-) CABG (-) dysrhythmias (-) Valvular Problems/Murmurs     Neuro/Psych negative neurological ROS  negative psych ROS   GI/Hepatic negative GI ROS, Neg liver ROS,   Endo/Other  diabetes (borderline)  Renal/GU Renal disease  negative genitourinary   Musculoskeletal   Abdominal   Peds  Hematology negative hematology ROS (+)   Anesthesia Other Findings Past Medical History: No date: HLD (hyperlipidemia) No date: Hypertension No date: Obesity No date: Pre-diabetes No date: Rosacea No date: Skin cancer No date: Urinary tract infection   Reproductive/Obstetrics negative OB ROS                             Anesthesia Physical Anesthesia Plan  ASA: III  Anesthesia Plan: MAC   Post-op Pain Management:    Induction:   Airway Management Planned:   Additional Equipment:   Intra-op Plan:   Post-operative Plan:   Informed Consent: I have reviewed the patients History and Physical, chart, labs and discussed the procedure including the risks, benefits and alternatives for the proposed anesthesia with the patient or authorized representative who has indicated his/her understanding and acceptance.   Dental Advisory Given  Plan Discussed with: Anesthesiologist, CRNA and  Surgeon  Anesthesia Plan Comments:         Anesthesia Quick Evaluation

## 2016-11-29 NOTE — Anesthesia Procedure Notes (Signed)
Procedure Name: MAC Performed by: Bowman Higbie Pre-anesthesia Checklist: Patient identified, Emergency Drugs available, Suction available, Patient being monitored and Timeout performed Oxygen Delivery Method: Nasal cannula       

## 2016-11-29 NOTE — Discharge Instructions (Signed)

## 2016-11-29 NOTE — Addendum Note (Signed)
Addendum  created 11/29/16 0845 by Demetrius Charity, CRNA   Anesthesia Attestations filed

## 2017-11-19 IMAGING — CT CT ANGIO CHEST
1 of 2 series · 18 of 30 positions shown · IV contrast (APPLIED)
Comparison: PA and lateral chest 09/09/2015.

CLINICAL DATA: Mid and left-sided chest pain for 1 week. Shortness
of breath with exertion. Initial encounter.

EXAM:
CT ANGIOGRAPHY CHEST WITH CONTRAST
TECHNIQUE: Multidetector CT imaging of the chest was performed using the
standard protocol during bolus administration of intravenous
contrast. Multiplanar CT image reconstructions and MIPs were
obtained to evaluate the vascular anatomy.
CONTRAST:  80 ml OMNIPAQUE IOHEXOL 350 MG/ML SOLN

[Series 5: pe 1.0 thins · axial · 0.71mm/px · z∈[-821,-536]mm · 18 of 321 slices shown]
[im 18/321  lung]
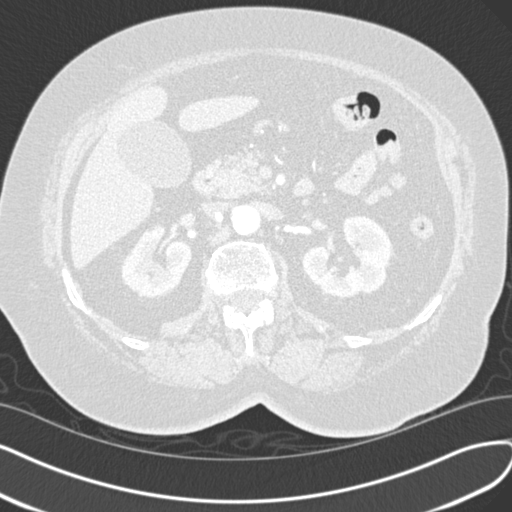
[im 36/321  mediastinal]
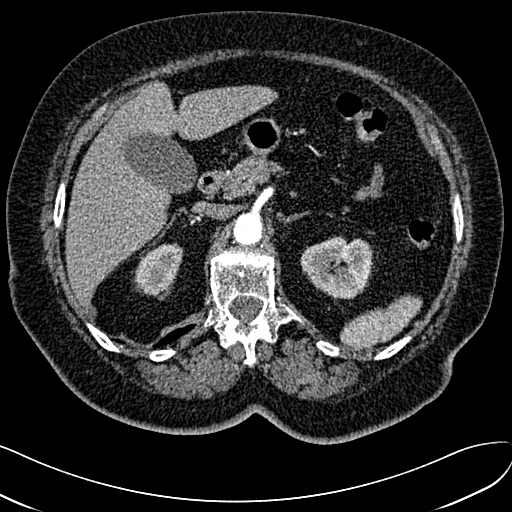
[im 54/321  lung]
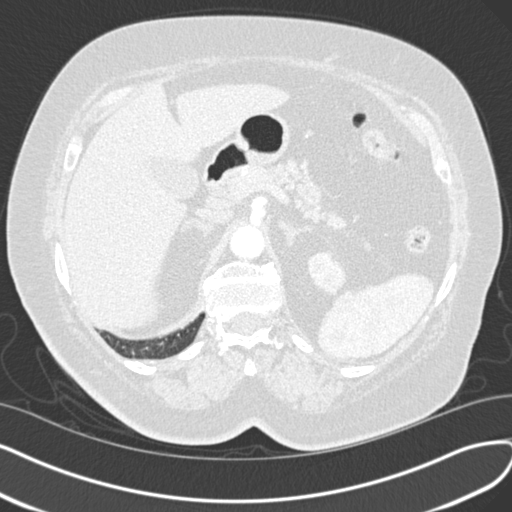
[im 72/321  mediastinal]
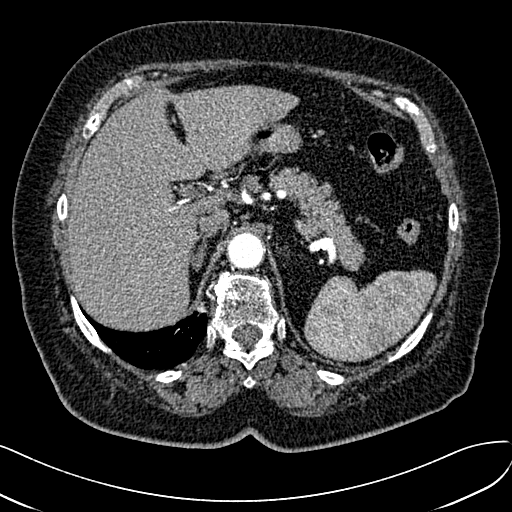
[im 89/321  lung]
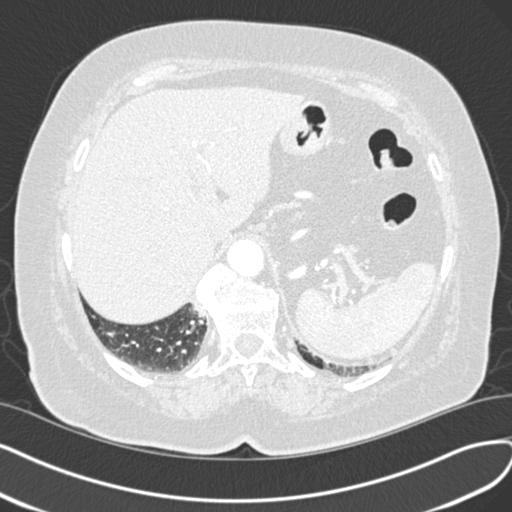
[im 107/321  mediastinal]
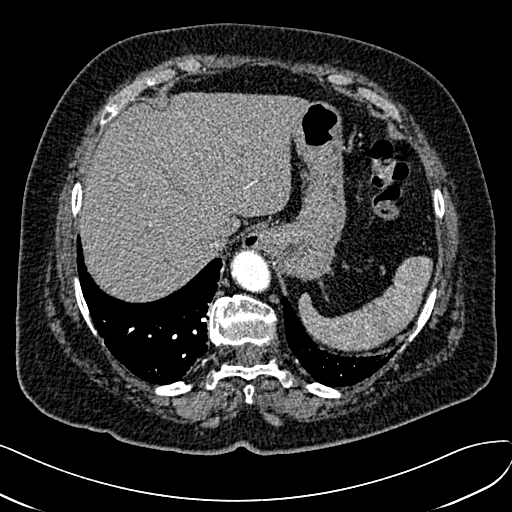
[im 125/321  lung]
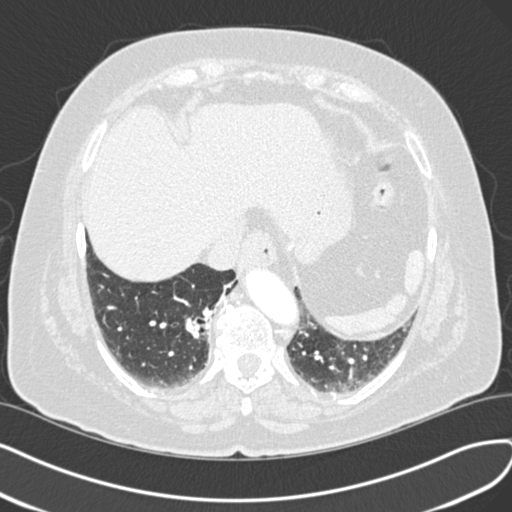
[im 143/321  mediastinal]
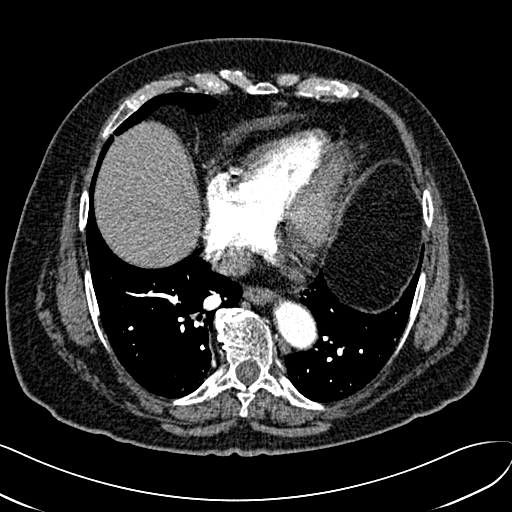
[im 151/321  lung]
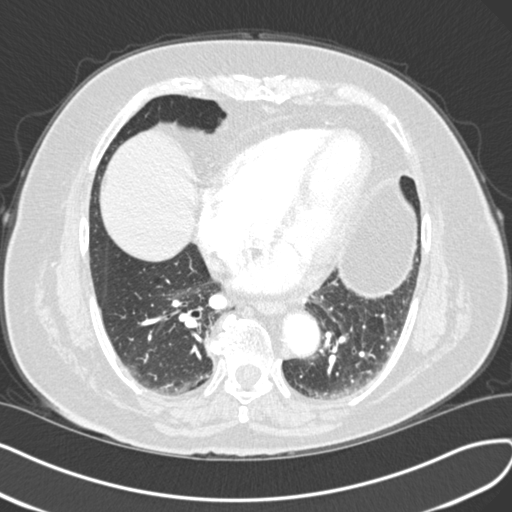
[im 161/321  mediastinal]
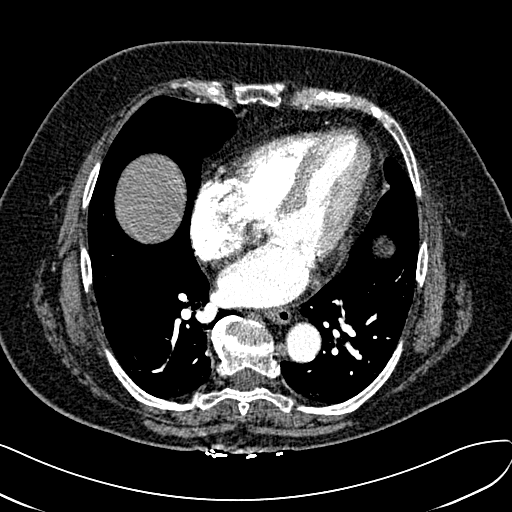
[im 178/321  lung]
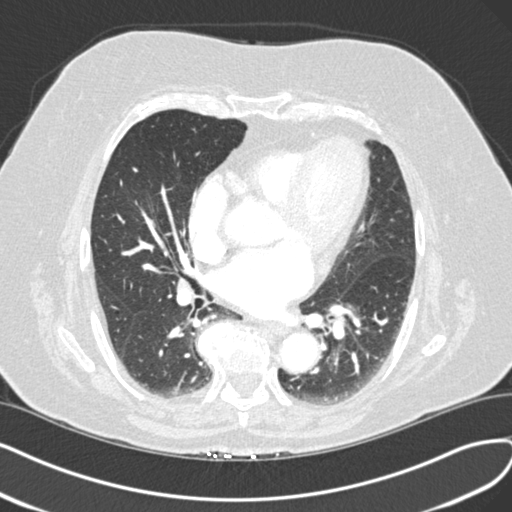
[im 196/321  mediastinal]
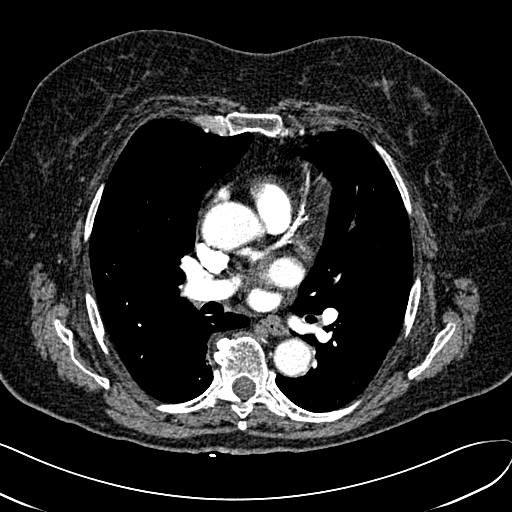
[im 214/321  lung]
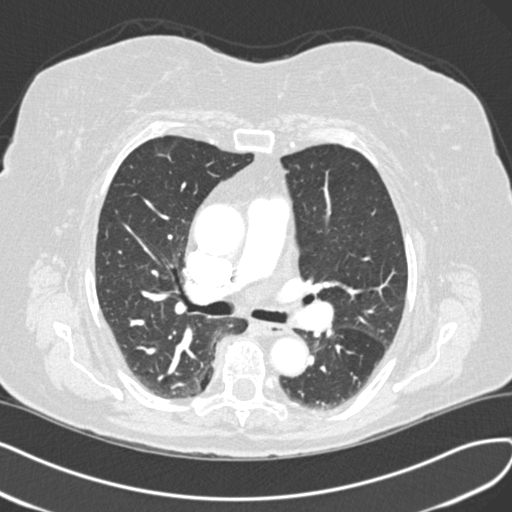
[im 232/321  mediastinal]
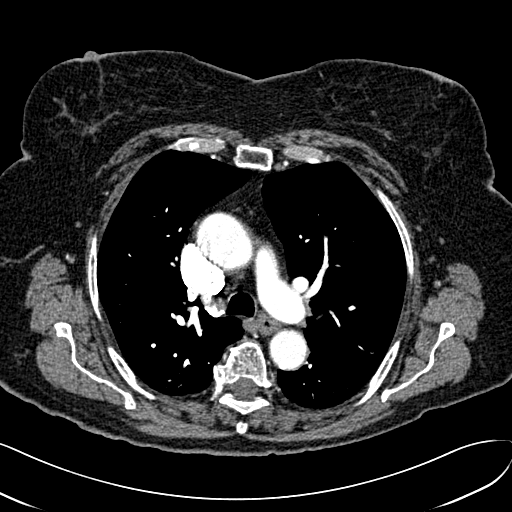
[im 249/321  lung]
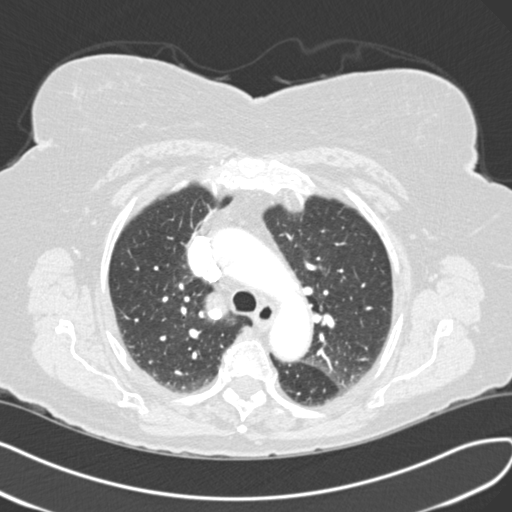
[im 267/321  mediastinal]
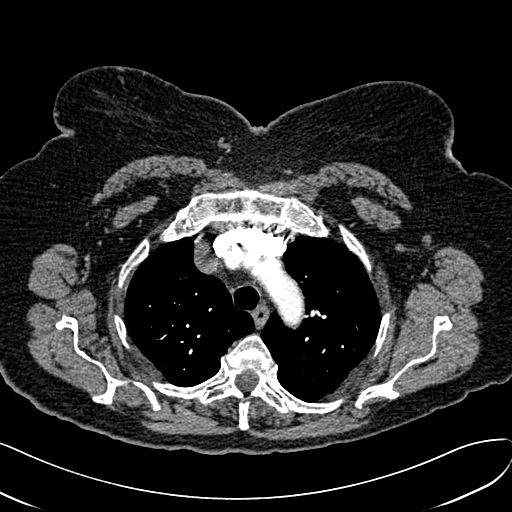
[im 285/321  lung]
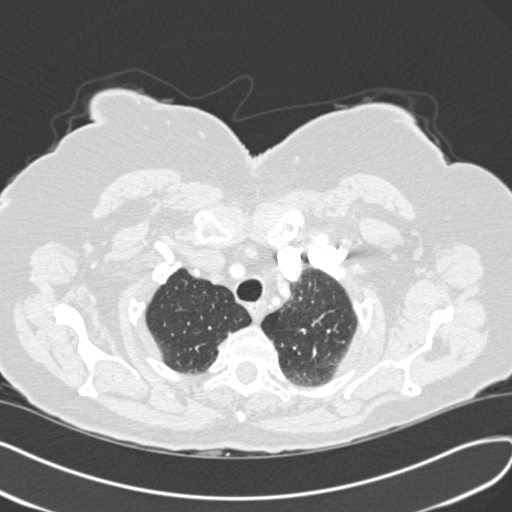
[im 303/321  mediastinal]
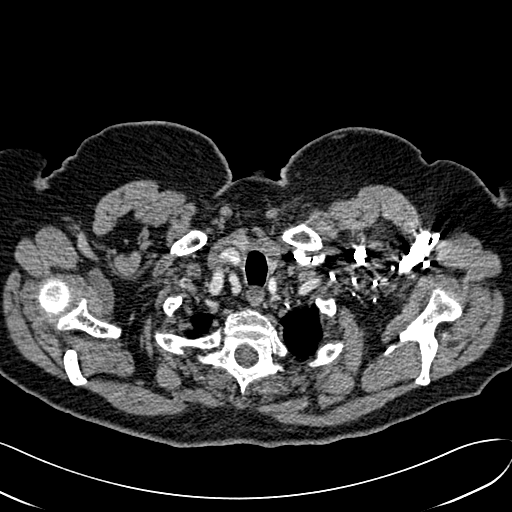

[18 of 30 positions shown; findings below may reference images not displayed]

FINDINGS: No pulmonary embolus identified. There is no axillary, hilar or
mediastinal lymphadenopathy. Calcific aortic and coronary
atherosclerosis is noted. No pleural or pericardial effusion. Heart
size is normal. Mild dependent atelectatic changes seen in the lung
bases. A 0.4 cm nodule is seen in the lingula on image 7. The lungs
are otherwise unremarkable.

Incidentally imaged upper abdomen demonstrates a punctate
hypoattenuating lesion in the dome of the liver which is likely a
cyst but cannot be definitively characterized. Imaged upper abdomen
is otherwise unremarkable. No lytic or sclerotic bony lesion is
seen. There is some glenohumeral osteoarthritis on the left. Small
hemangioma in an upper thoracic vertebra is incidentally noted.

Review of the MIP images confirms the above findings.
IMPRESSION: Negative for pulmonary embolus. No acute abnormality or finding to
explain the patient's symptoms.

Calcific aortic and coronary atherosclerosis.

0.4 cm nodule in the lingula. If the patient is at high risk for
bronchogenic carcinoma, follow-up chest CT at 1 year is recommended.
If the patient is at low risk, no follow-up is needed. This
recommendation follows the consensus statement: Guidelines for
Management of Small Pulmonary Nodules Detected on CT Scans: A
Statement from the [HOSPITAL] as published in Radiology

## 2018-04-13 IMAGING — US US ABDOMEN LIMITED
1 series · 14 of 25 positions shown · non-contrast
Comparison: CT 09/15/2015.

CLINICAL DATA: Elevated LFTs.

EXAM:
US ABDOMEN LIMITED - RIGHT UPPER QUADRANT

[Series 1: us abdomen limited · 0.21mm/px · 14 of 47 slices shown]
[im 1/47]
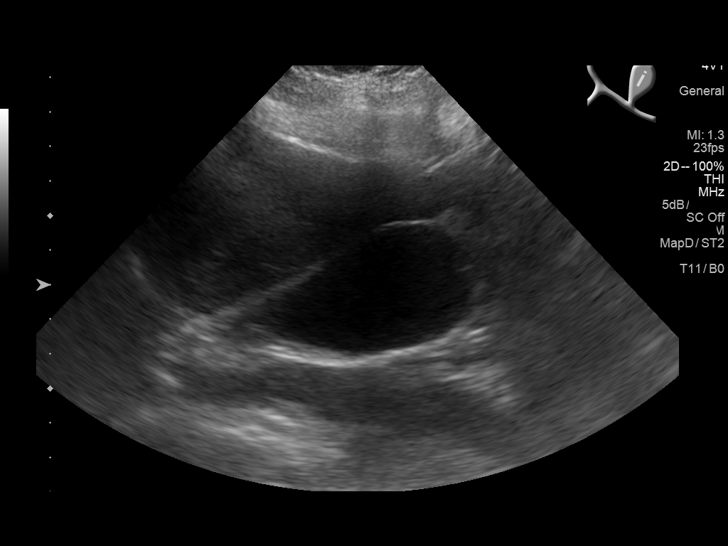
[im 4/47]
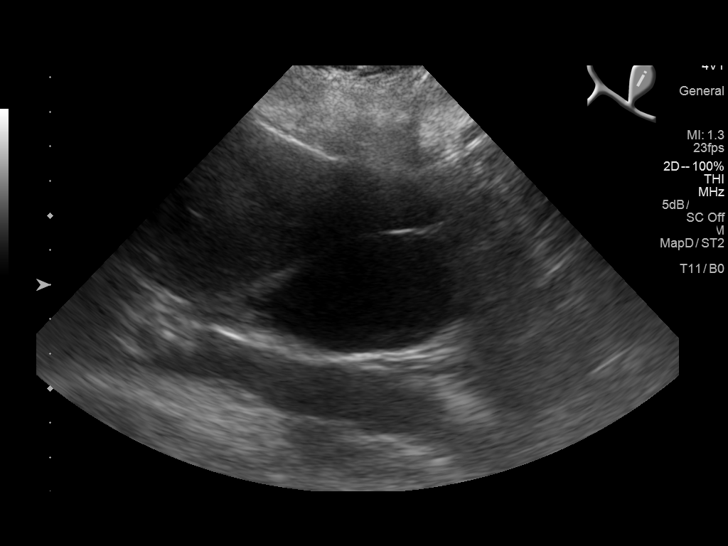
[im 8/47]
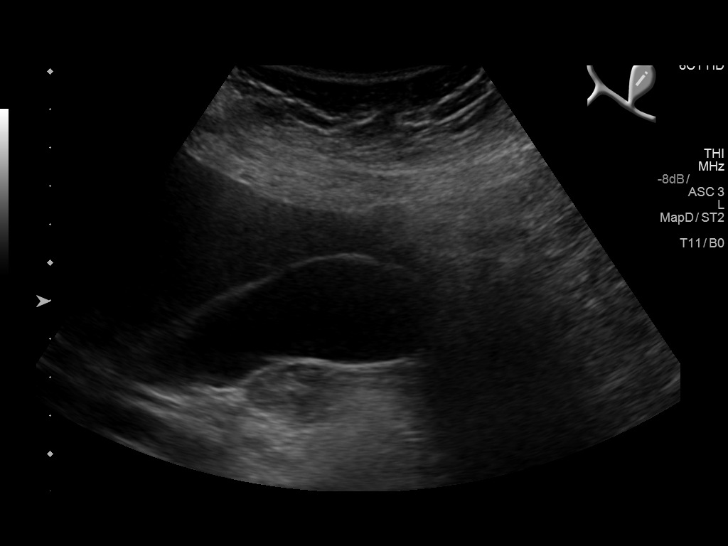
[im 12/47]
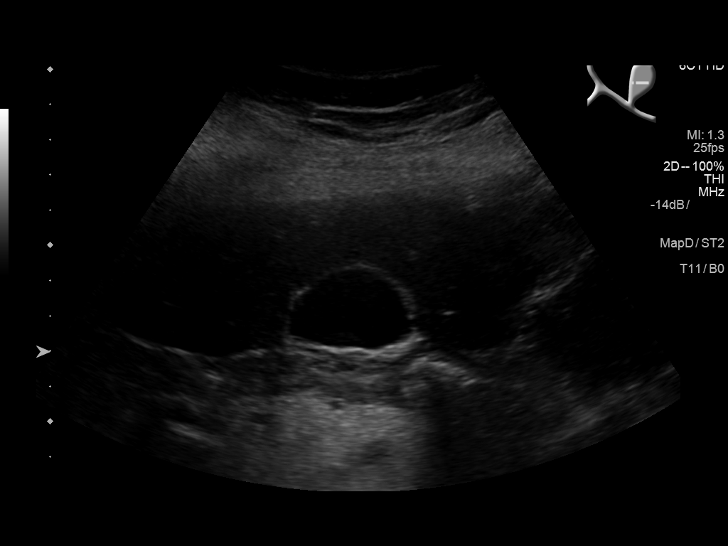
[im 16/47]
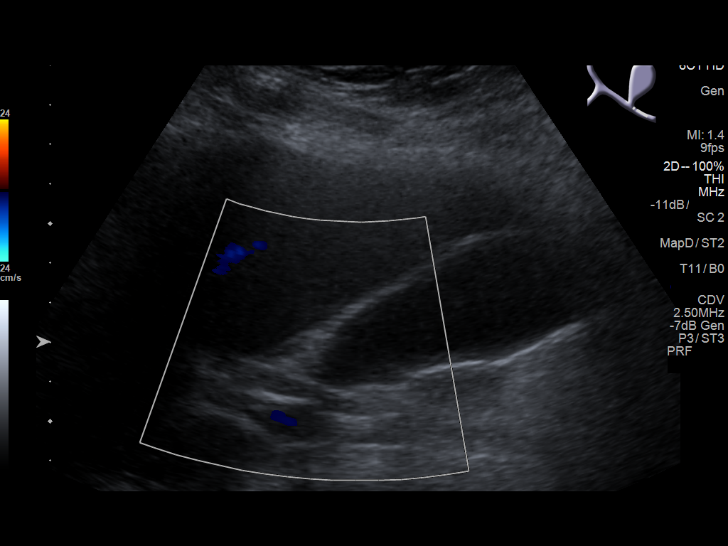
[im 18/47]
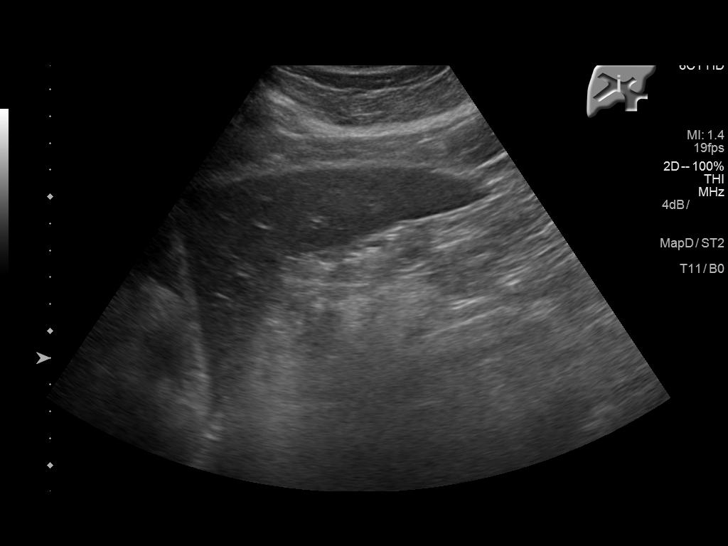
[im 22/47]
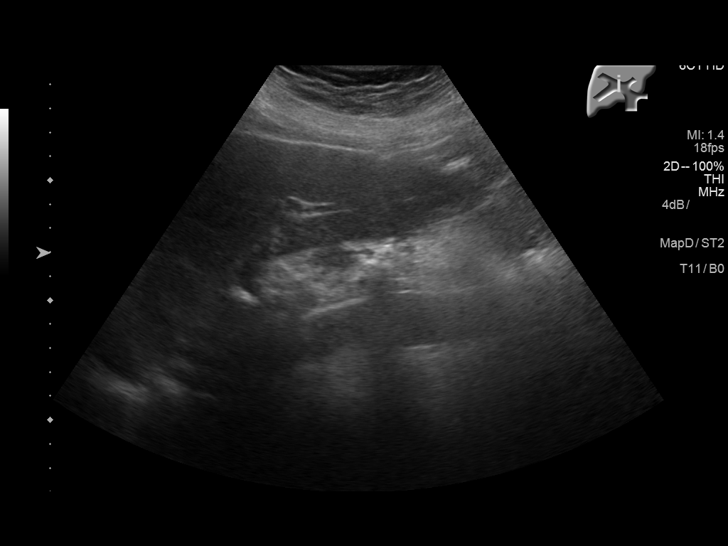
[im 25/47]
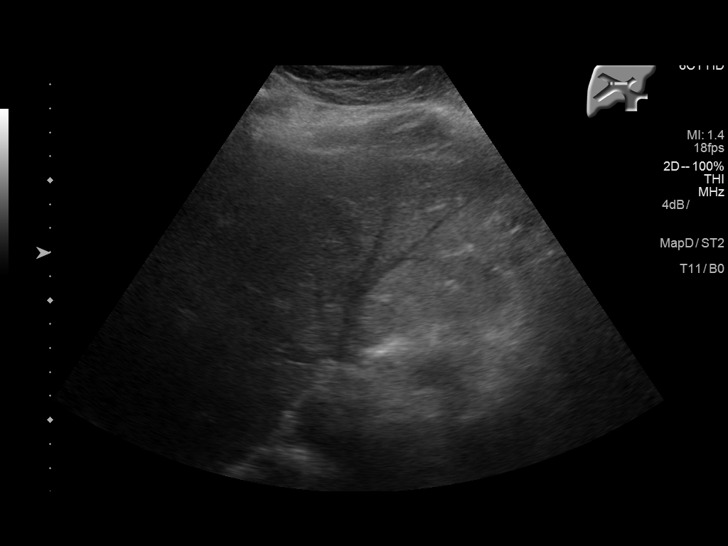
[im 29/47]
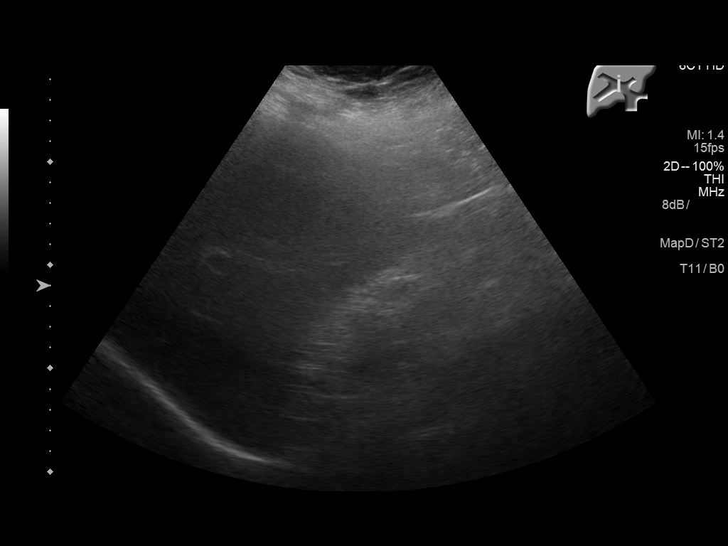
[im 31/47]
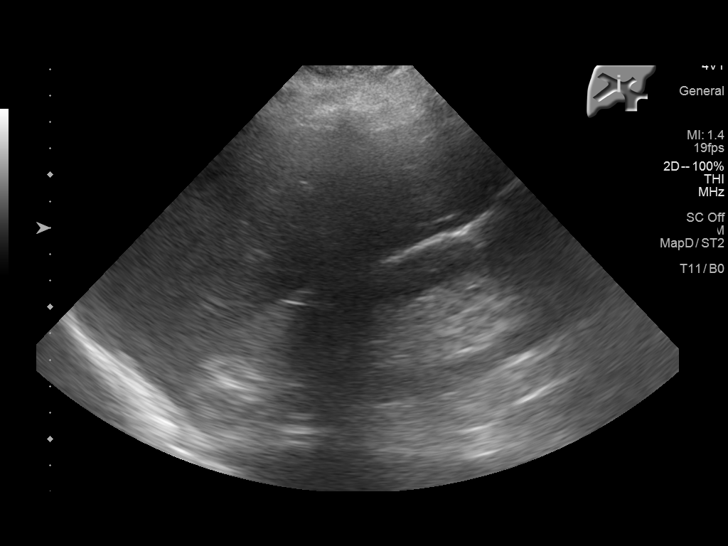
[im 35/47]
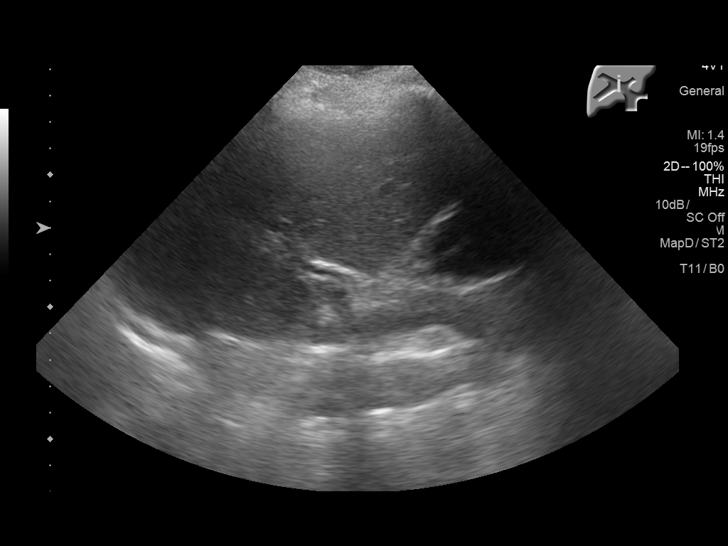
[im 39/47]
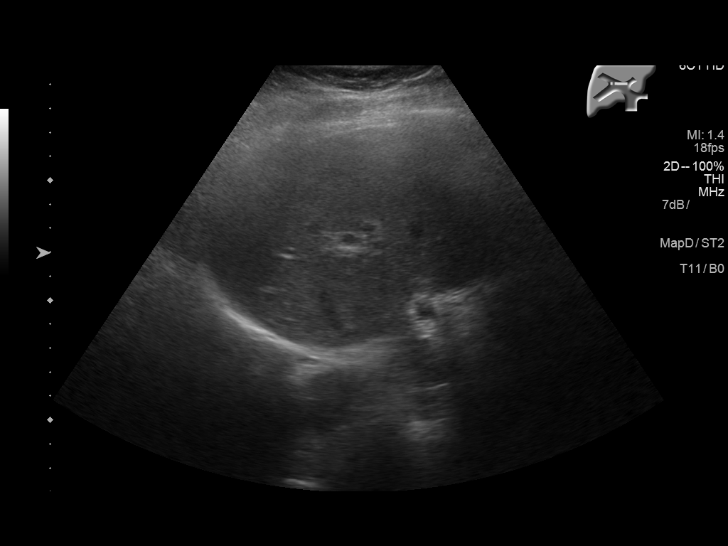
[im 43/47]
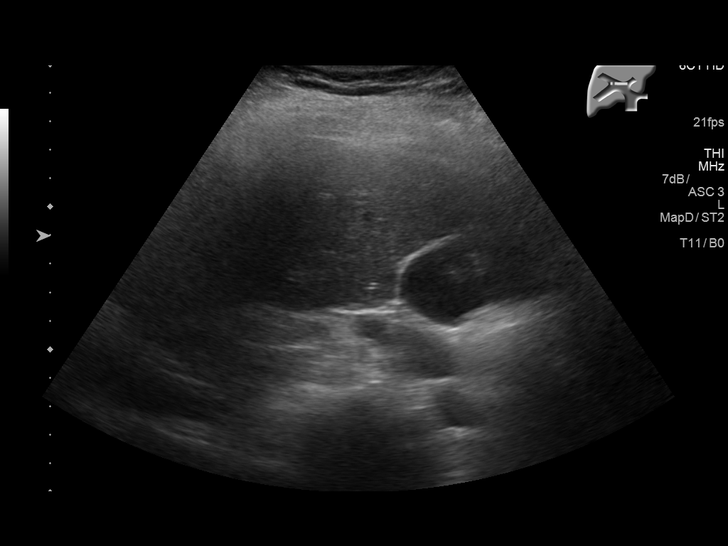
[im 47/47]
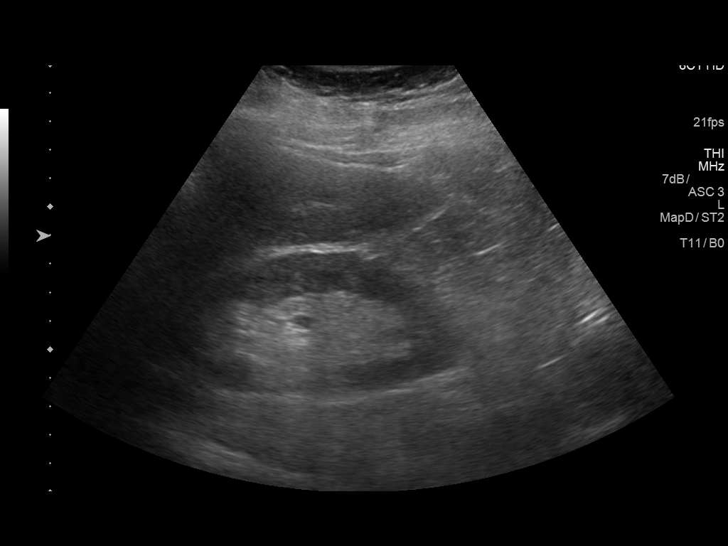

[14 of 25 positions shown; findings below may reference images not displayed]

FINDINGS: Gallbladder:

No gallstones or wall thickening visualized. No sonographic Murphy
sign noted by sonographer.

Common bile duct:

Diameter: 5.4 mm

Liver:

No focal lesion identified. Within normal limits in parenchymal
echogenicity.
IMPRESSION: Negative exam.

## 2018-05-20 IMAGING — CR DG CHEST 2V
2 series · 2 of 2 positions shown · non-contrast
Comparison: CT chest 09/15/2015

CLINICAL DATA: Shortness of breath.

EXAM:
CHEST  2 VIEW

[chest pa]
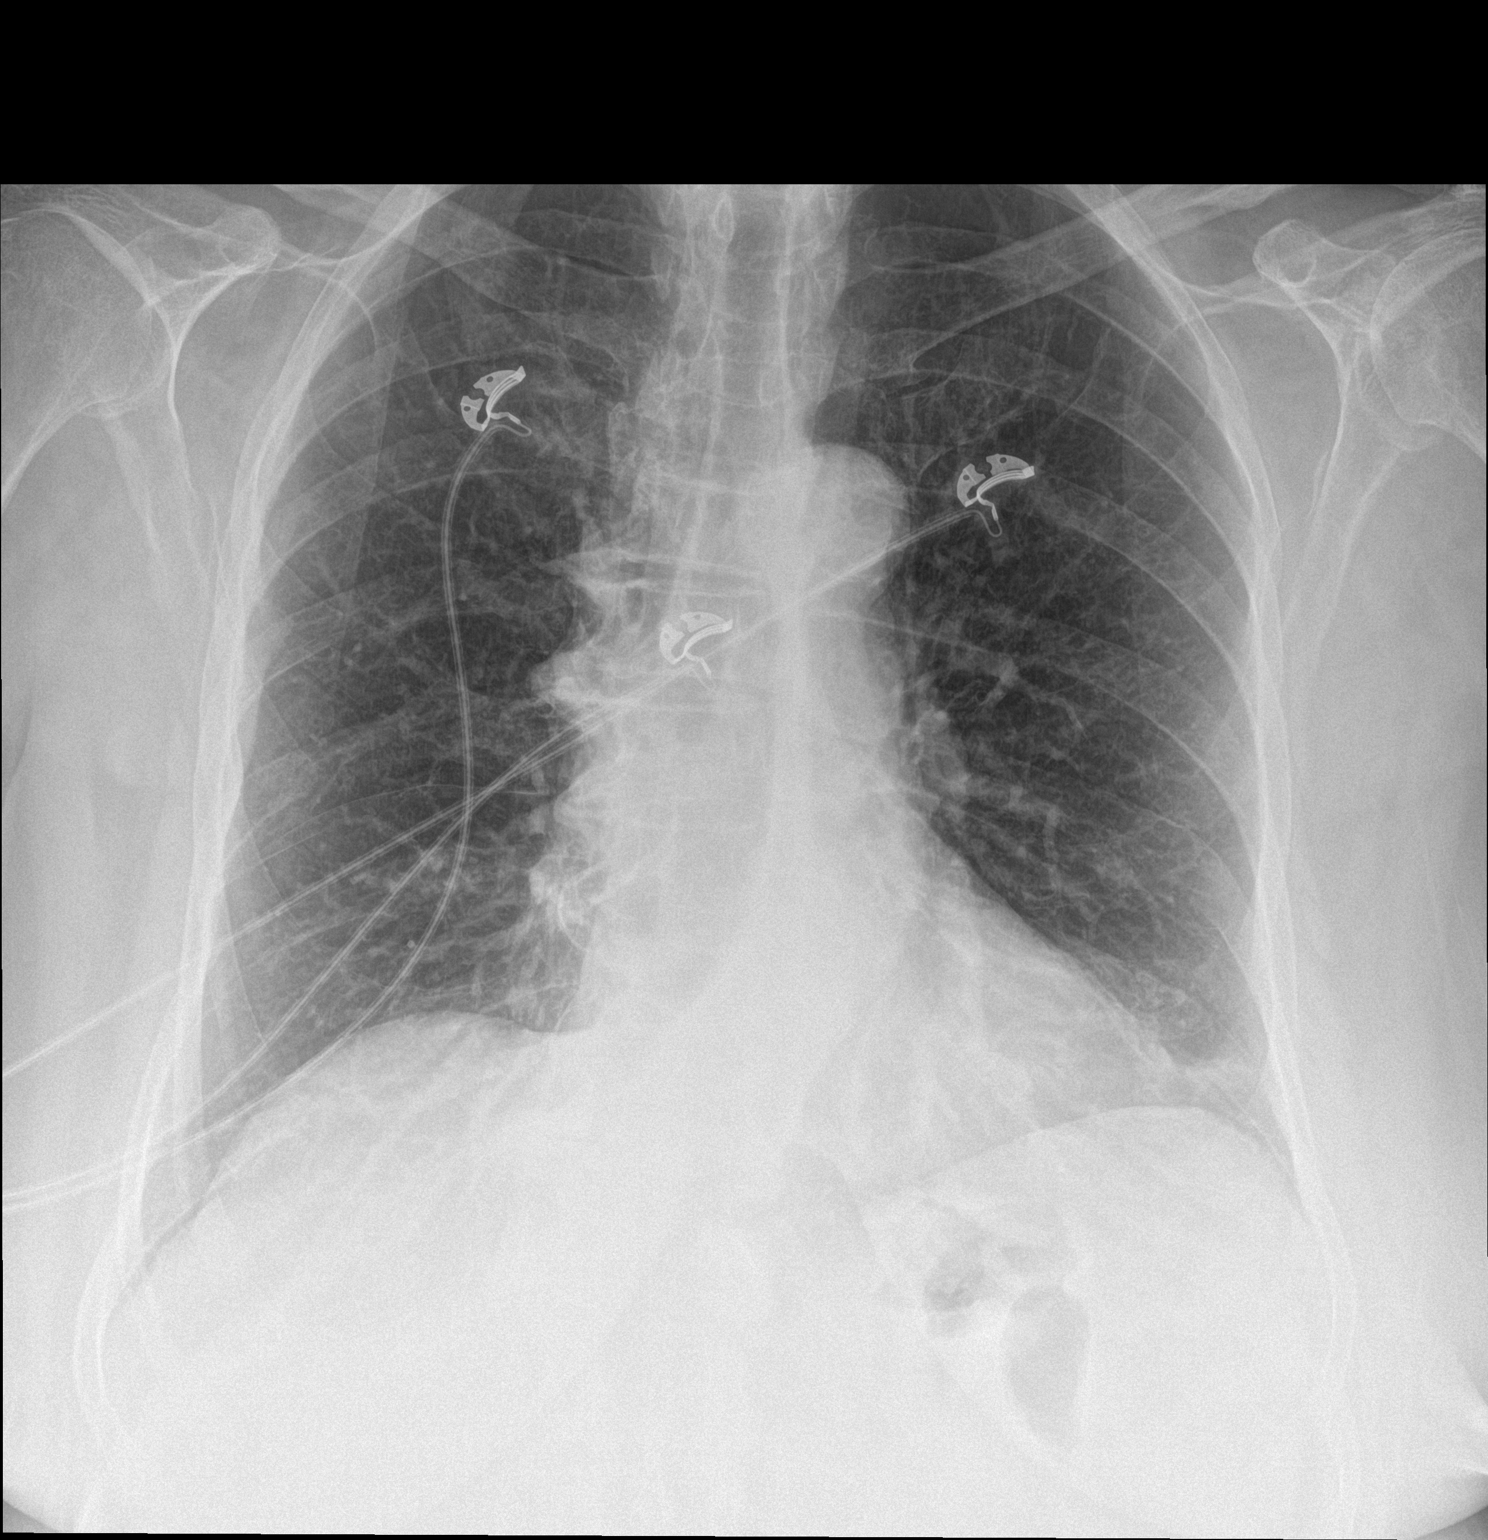

[chest lat]
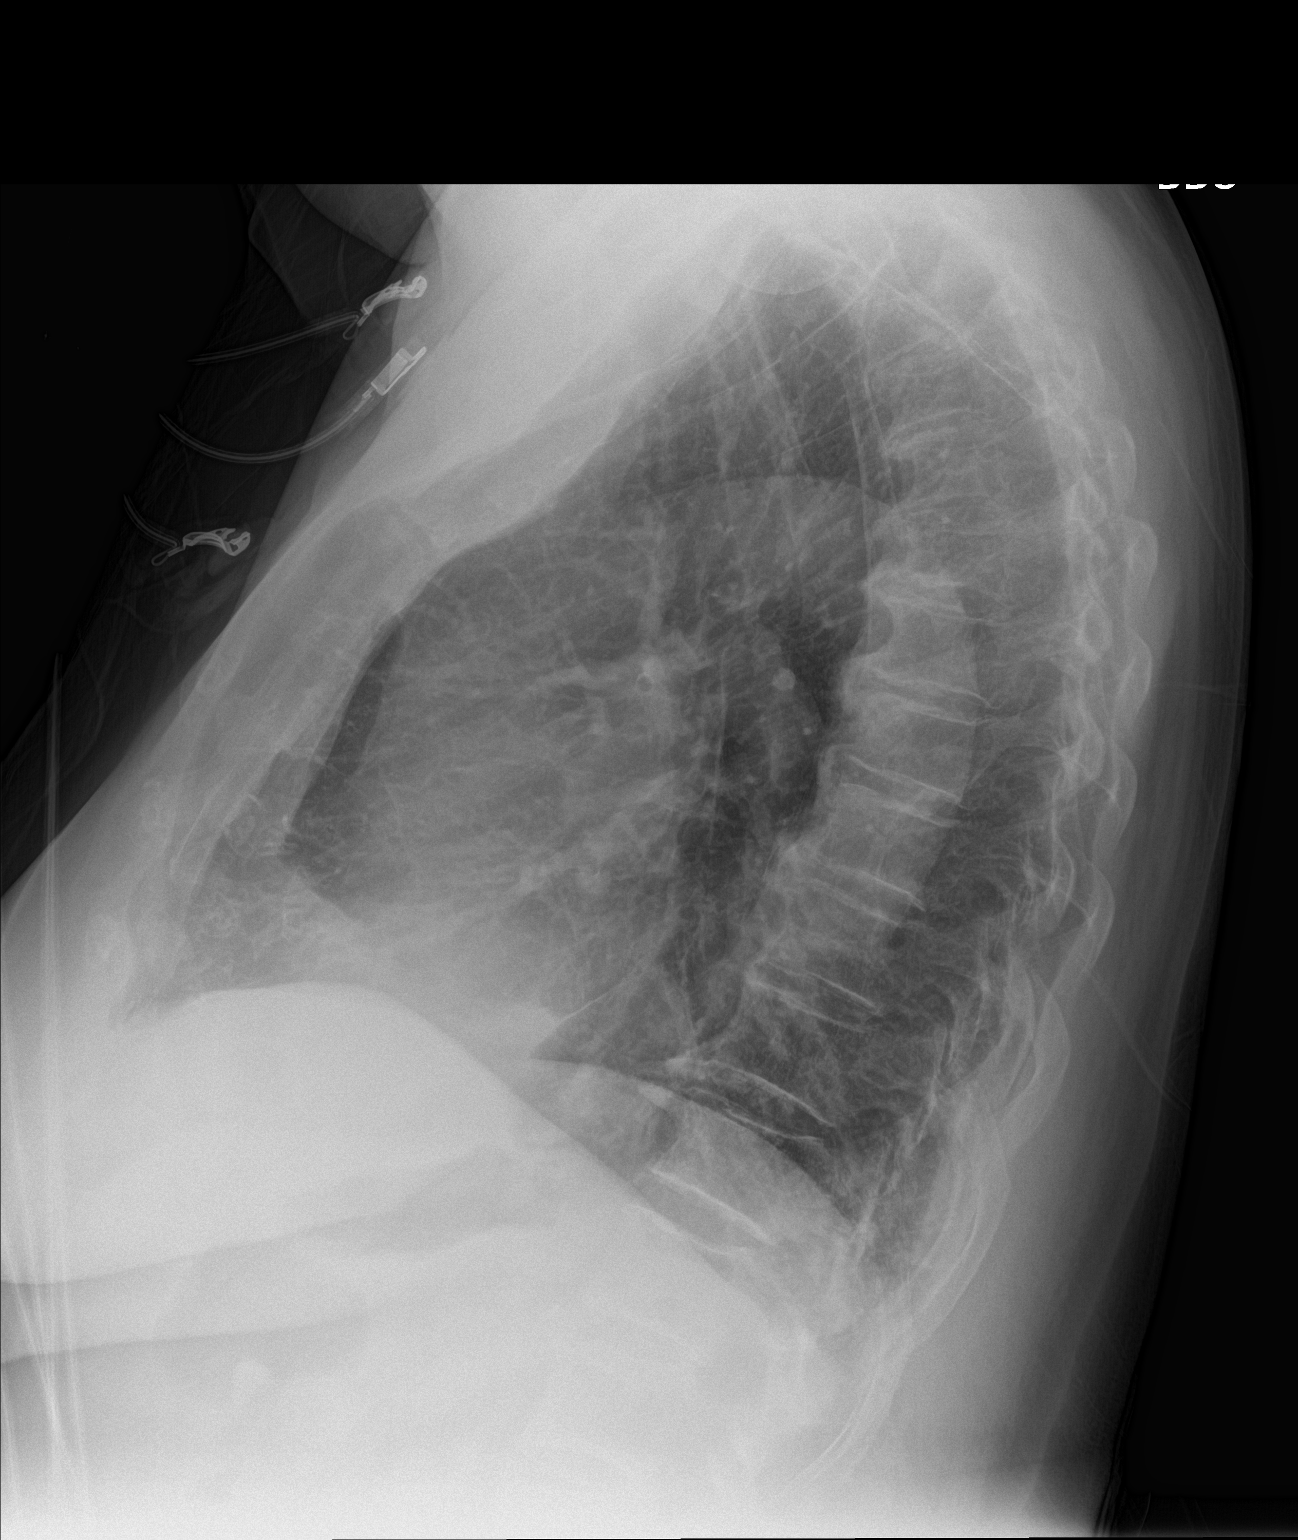

[2 of 2 positions shown; findings below may reference images not displayed]

FINDINGS: There is no focal parenchymal opacity. There is no pleural effusion
or pneumothorax. The heart and mediastinal contours are
unremarkable.

The osseous structures are unremarkable.
IMPRESSION: No active cardiopulmonary disease.

## 2018-05-23 DIAGNOSIS — C4492 Squamous cell carcinoma of skin, unspecified: Secondary | ICD-10-CM

## 2018-05-23 HISTORY — DX: Squamous cell carcinoma of skin, unspecified: C44.92

## 2019-03-28 ENCOUNTER — Encounter: Payer: Self-pay | Admitting: Emergency Medicine

## 2019-03-28 ENCOUNTER — Other Ambulatory Visit: Payer: Self-pay

## 2019-03-28 DIAGNOSIS — Z85828 Personal history of other malignant neoplasm of skin: Secondary | ICD-10-CM | POA: Diagnosis not present

## 2019-03-28 DIAGNOSIS — R040 Epistaxis: Secondary | ICD-10-CM | POA: Diagnosis present

## 2019-03-28 DIAGNOSIS — Z79899 Other long term (current) drug therapy: Secondary | ICD-10-CM | POA: Diagnosis not present

## 2019-03-28 DIAGNOSIS — Z87891 Personal history of nicotine dependence: Secondary | ICD-10-CM | POA: Diagnosis not present

## 2019-03-28 DIAGNOSIS — I10 Essential (primary) hypertension: Secondary | ICD-10-CM | POA: Diagnosis not present

## 2019-03-28 DIAGNOSIS — Z7982 Long term (current) use of aspirin: Secondary | ICD-10-CM | POA: Diagnosis not present

## 2019-03-28 NOTE — ED Triage Notes (Signed)
PT states nose bleeds few time this week, states today was the worse. Pt states heavy bleeding lasted aprox 48min. Pt is currently having light bleeding at this time. Denies any dizziness. Hx of sinusitis . Denies any anticoag use

## 2019-03-29 ENCOUNTER — Emergency Department
Admission: EM | Admit: 2019-03-29 | Discharge: 2019-03-29 | Disposition: A | Discharge status: Home / Self Care | Payer: Medicare Other | Attending: Emergency Medicine | Admitting: Emergency Medicine

## 2019-03-29 DIAGNOSIS — R040 Epistaxis: Secondary | ICD-10-CM

## 2019-03-29 MED ORDER — OXYMETAZOLINE HCL 0.05 % NA SOLN: 2.0000 | Freq: Two times a day (BID) | NASAL | 2 refills | Status: DC | PRN

## 2019-03-29 MED ORDER — AMOXICILLIN-POT CLAVULANATE 875-125 MG PO TABS: 1.0000 | ORAL_TABLET | Freq: Two times a day (BID) | ORAL | 0 refills | Status: DC

## 2019-03-29 MED ORDER — OXYMETAZOLINE HCL 0.05 % NA SOLN: 2.0000 | Freq: Two times a day (BID) | NASAL | 2 refills | Status: AC | PRN

## 2019-03-29 MED ORDER — AMOXICILLIN-POT CLAVULANATE 875-125 MG PO TABS: 1.0000 | ORAL_TABLET | Freq: Two times a day (BID) | ORAL | 0 refills | Status: AC

## 2019-03-29 NOTE — Discharge Instructions (Addendum)
Please seek medical attention for any high fevers, chest pain, shortness of breath, change in behavior, persistent vomiting, bloody stool or any other new or concerning symptoms.  

## 2019-03-29 NOTE — ED Provider Notes (Signed)
[facility] Emergency Department Provider Note  ____________________________________________   I have reviewed the triage vital signs and the nursing notes.   HISTORY  Chief Complaint Epistaxis   History limited by: Not Limited   HPI Shelly Phillips is a 85 y.o. female who presents to the emergency department today because of concern for nosebleed.  Patient states that for the past couple weeks she has been having problems with nasal drainage.  She states she can feel thick mucus down the back of her throat.  The patient states that for the past week she started developing nosebleeds out of the right side of her nose.  She has had multiple bleeds this week.  She came in today because the bleed was not stopping as quickly.  The patient states that she was trying direct pressure.  Patient denies history of significant nosebleeds.  Patient denies taking blood thinning medications.   Records reviewed. Per medical record review patient has a history of HLD, UTI  Past Medical History:  Diagnosis Date  . HLD (hyperlipidemia)   . Hypertension   . Obesity   . Pre-diabetes   . Rosacea   . Skin cancer   . Urinary tract infection     Patient Active Problem List   Diagnosis Date Noted  . Acute renal failure ([place]) 03/15/2016  . UTI (urinary tract infection) 03/15/2016  . Acute renal failure (ARF) ([place]) 03/15/2016  . Unstable angina ([place]) 09/10/2015  . Essential hypertension 09/10/2015  . Hyperlipidemia 09/10/2015  . Morbid obesity ([place]) 09/10/2015  . Pre-diabetes 09/10/2015  . Pleuritic chest pain 09/10/2015  . Elevated troponin 09/10/2015  . Renal insufficiency 09/10/2015  . Leukocytosis 09/10/2015  . Chest pain 09/09/2015    Past Surgical History:  Procedure Laterality Date  . CATARACT EXTRACTION W/PHACO Right 11/29/2016   Procedure: CATARACT EXTRACTION PHACO AND INTRAOCULAR LENS PLACEMENT (IOC);  Surgeon: Birder Robson, MD;  Location: [facility] ORS;   Service: Ophthalmology;  Laterality: Right;  [place] 49.8 AP% 21.9 CDE 10.89 Fluid Pack Lot # [id number]  . FACIAL RECONSTRUCTION SURGERY    . FRACTURE SURGERY      Prior to Admission medications   Medication Sig Start Date End Date Taking? Authorizing Provider  acetaminophen (TYLENOL) 500 MG tablet Take 1,000 mg by mouth at bedtime.    [provider]  aspirin EC 81 MG tablet Take 1 tablet by mouth daily. 02/27/15   [provider]  atorvastatin (LIPITOR) 20 MG tablet Take 20 mg by mouth daily.    [provider]  ibuprofen (ADVIL,MOTRIN) 200 MG tablet Take 400 mg by mouth 2 (two) times daily.    [provider]  losartan-hydrochlorothiazide (HYZAAR) 100-12.5 MG tablet Take 1 tablet by mouth daily.    [provider]  metoprolol tartrate (LOPRESSOR) 25 MG tablet Take 1 tablet (25 mg total) by mouth 2 (two) times daily. 03/17/16   Gouru, Illene Silver, MD  sodium chloride (OCEAN) 0.65 % SOLN nasal spray Place 2 sprays into both nostrils 2 (two) times daily.    [provider]  triamcinolone (NASACORT ALLERGY 24HR) 55 MCG/ACT AERO nasal inhaler Place 1 spray into the nose 2 (two) times daily.    [provider]    Allergies Demerol [meperidine]  Family History  Problem Relation Age of Onset  . CAD Father   . Cancer Brother   . Parkinson's disease Mother     Social History Social History   Tobacco Use  . Smoking status: Former Research scientist (life sciences)  .  Smokeless tobacco: Never Used  Substance Use Topics  . Alcohol use: No  . Drug use: No    Review of Systems Constitutional: No fever/chills Eyes: No visual changes. ENT: Positive for right nose bleed.  Cardiovascular: Denies chest pain. Respiratory: Denies shortness of breath. Gastrointestinal: No abdominal pain.  No nausea, no vomiting.  No diarrhea.   Genitourinary: Negative for dysuria. Musculoskeletal: Negative for back pain. Skin: Negative for rash. Neurological: Positive for  headache. ____________________________________________   PHYSICAL EXAM:  VITAL SIGNS: ED Triage Vitals  Enc Vitals Group     BP 03/28/19 2126 (!) 159/88     Pulse Rate 03/28/19 2126 60     Resp 03/28/19 2126 20     Temp 03/28/19 2126 98.4 F (36.9 C)     Temp Source 03/28/19 2126 Oral     SpO2 03/28/19 2126 95 %     Weight --      Height --      Head Circumference --      Peak Flow --      Pain Score 03/28/19 2127 0   Constitutional: Alert and oriented.  Eyes: Conjunctivae are normal.  ENT      Head: Normocephalic and atraumatic.      Nose: Dried blood in the right nares. No active bleeding.       Mouth/Throat: Mucous membranes are moist.      Neck: No stridor. Hematological/Lymphatic/Immunilogical: No cervical lymphadenopathy. Cardiovascular: Normal rate, regular rhythm.  No murmurs, rubs, or gallops.  Respiratory: Normal respiratory effort without tachypnea nor retractions. Breath sounds are clear and equal bilaterally. No wheezes/rales/rhonchi. Musculoskeletal: Normal range of motion in all extremities.  Neurologic:  Normal speech and language. No gross focal neurologic deficits are appreciated.  Skin:  Skin is warm, dry and intact. No rash noted. Psychiatric: Mood and affect are normal. Speech and behavior are normal. Patient exhibits appropriate insight and judgment.  ____________________________________________    LABS (pertinent positives/negatives)  None  ____________________________________________   EKG  None  ____________________________________________    RADIOLOGY  None  ____________________________________________   PROCEDURES  Procedures  ____________________________________________   INITIAL IMPRESSION / ASSESSMENT AND PLAN / ED COURSE  Pertinent labs & imaging results that were available during my care of the patient were reviewed by me and considered in my medical decision making (see chart for details).   Patient presented to  the emergency department today because of concerns for nosebleed.  The patient's exam does show dried blood in the right nares although no active bleeding no obvious source of the bleed.  Patient is concern for sinusitis given that symptoms have been present for over 1 week.  Did discuss care of nosebleeds with patient.  Will give antibiotics given the patient has had sinus symptoms for greater than 1 week.  Discussed importance of follow-up with ENT.  ____________________________________________   FINAL CLINICAL IMPRESSION(S) / ED DIAGNOSES  Final diagnoses:  Epistaxis     Note: This dictation was prepared with Dragon dictation. Any transcriptional errors that result from this process are unintentional     Nance Pear, MD 03/29/19 0400

## 2020-03-02 ENCOUNTER — Ambulatory Visit: Payer: Medicare Other | Attending: Internal Medicine

## 2020-03-02 DIAGNOSIS — Z23 Encounter for immunization: Secondary | ICD-10-CM

## 2020-03-02 NOTE — Progress Notes (Signed)
   Covid-19 Vaccination Clinic  Name:  Shelly Phillips    MRN: [id number] DOB: 06/10/1933  03/02/2020  [name] was observed post Covid-19 immunization for 15 minutes without incident. She was provided with Vaccine Information Sheet and instruction to access the V-Safe system.   [name] was instructed to call 911 with any severe reactions post vaccine: Marland Kitchen Difficulty breathing  . Swelling of face and throat  . A fast heartbeat  . A bad rash all over body  . Dizziness and weakness   Immunizations Administered    Name Date Dose VIS Date Route   Pfizer COVID-19 Vaccine 03/02/2020  9:49 AM 0.3 mL 09/11/2018 Intramuscular   Manufacturer: [place]   Lot: [id number]   [place]: [id number]

## 2020-03-30 ENCOUNTER — Ambulatory Visit: Payer: Medicare Other

## 2020-03-30 ENCOUNTER — Ambulatory Visit: Payer: Medicare Other | Attending: Critical Care Medicine

## 2020-03-30 DIAGNOSIS — Z23 Encounter for immunization: Secondary | ICD-10-CM

## 2020-03-30 NOTE — Progress Notes (Signed)
   Covid-19 Vaccination Clinic  Name:  Shelly Phillips    MRN: [id number] DOB: May 19, 1933  03/30/2020  [name] was observed post Covid-19 immunization for 15 minutes without incident. She was provided with Vaccine Information Sheet and instruction to access the V-Safe system.   [name] was instructed to call 911 with any severe reactions post vaccine: Marland Kitchen Difficulty breathing  . Swelling of face and throat  . A fast heartbeat  . A bad rash all over body  . Dizziness and weakness   Immunizations Administered    Name Date Dose VIS Date Route   Pfizer COVID-19 Vaccine 03/30/2020  9:46 AM 0.3 mL 09/11/2018 Intramuscular   Manufacturer: [place]   Lot: [id number]   [place]: [id number]

## 2020-11-16 ENCOUNTER — Ambulatory Visit: Payer: Self-pay | Admitting: *Deleted

## 2020-11-16 NOTE — Telephone Encounter (Signed)
Patient's friend called to assist patient with scheduling 2nd [place] booster. Scheduled booster for Friday 11/20/20 at [place] at 11:30 am.

## 2020-11-20 ENCOUNTER — Ambulatory Visit: Payer: Medicare Other | Attending: Internal Medicine

## 2020-11-20 ENCOUNTER — Other Ambulatory Visit: Payer: Self-pay

## 2020-11-20 DIAGNOSIS — Z23 Encounter for immunization: Secondary | ICD-10-CM

## 2020-11-20 MED ORDER — PFIZER-BIONT COVID-19 VAC-TRIS 30 MCG/0.3ML IM SUSP
INTRAMUSCULAR | 0 refills | Status: DC
  Filled 2020-11-20: qty 0.3, 1d supply, fill #0

## 2020-11-20 NOTE — Progress Notes (Signed)
   Covid-19 Vaccination Clinic  Name:  Shelly Phillips    MRN: [id number] DOB: [birth year]  11/20/2020  Shelly Phillips was observed post Covid-19 immunization for 15 minutes without incident. She was provided with Vaccine Information Sheet and instruction to access the V-Safe system.   Shelly Phillips was instructed to call 911 with any severe reactions post vaccine: Marland Kitchen Difficulty breathing  . Swelling of face and throat  . A fast heartbeat  . A bad rash all over body  . Dizziness and weakness   Immunizations Administered    Name Date Dose VIS Date Route   PFIZER Comrnaty(Gray TOP) Covid-19 Vaccine 11/20/2020 11:42 AM 0.3 mL 06/25/2020 Intramuscular   Manufacturer: [place]   Lot: [id number]   NDC: [phone]

## 2022-10-19 ENCOUNTER — Ambulatory Visit (INDEPENDENT_AMBULATORY_CARE_PROVIDER_SITE_OTHER): Payer: Medicare Other | Admitting: Dermatology

## 2022-10-19 ENCOUNTER — Encounter: Payer: Self-pay | Admitting: Dermatology

## 2022-10-19 VITALS — BP 137/85

## 2022-10-19 DIAGNOSIS — C44319 Basal cell carcinoma of skin of other parts of face: Secondary | ICD-10-CM

## 2022-10-19 DIAGNOSIS — Z7189 Other specified counseling: Secondary | ICD-10-CM | POA: Diagnosis not present

## 2022-10-19 DIAGNOSIS — D2239 Melanocytic nevi of other parts of face: Secondary | ICD-10-CM

## 2022-10-19 DIAGNOSIS — L719 Rosacea, unspecified: Secondary | ICD-10-CM

## 2022-10-19 DIAGNOSIS — D485 Neoplasm of uncertain behavior of skin: Secondary | ICD-10-CM

## 2022-10-19 DIAGNOSIS — L578 Other skin changes due to chronic exposure to nonionizing radiation: Secondary | ICD-10-CM

## 2022-10-19 NOTE — Patient Instructions (Addendum)
Shave Excision Benign Lesion Wound Care Instructions  Leave the original bandage on for 24 hours if possible.  If the bandage becomes soaked or soiled before that time, it is OK to remove it and examine the wound.  A small amount of post-operative bleeding is normal.  If excessive bleeding occurs, remove the bandage, place gauze over the site and apply continuous pressure (no peeking) over the area for 20-30 minutes.  If this does not stop the bleeding, try again for 40 minutes.  If this does not work, please call our clinic as soon as possible (even if after-hours).    Twice a day, cleanse the wound with soap and water.  If a thick crust develops you may use a Q-tip dipped into dilute hydrogen peroxide (mix 1:1 with water) to dissolve it.  Hydrogen peroxide can slow the healing process, so use it only as needed.  After washing, apply Vaseline jelly or Polysporin ointment.  For best healing, the wound should be covered with a layer of ointment at all times.  This may mean re-applying the ointment several times a day.  For open wounds, continue until it has healed.    If you have any swelling, keep the area elevated.  Some redness, tenderness and white or yellow material in the wound is normal healing.  If the area becomes very sore and red, or develops a thick yellow-green material (pus), it may be infected; please notify us.    Wound healing continues for up to one year following surgery.  It is not unusual to experience pain in the scar from time to time during the interval.  If the pain becomes severe or the scar thickens, you should notify the office.  A slight amount of redness in a scar is expected for the first six months.  After six months, the redness subsides and the scar will soften and fade.  The color difference becomes less noticeable with time.  If there are any problems, return for a post-op surgery check at your earliest convenience.  Please call our office for any questions or  concerns.  Due to recent changes in healthcare laws, you may see results of your pathology and/or laboratory studies on MyChart before the doctors have had a chance to review them. We understand that in some cases there may be results that are confusing or concerning to you. Please understand that not all results are received at the same time and often the doctors may need to interpret multiple results in order to provide you with the best plan of care or course of treatment. Therefore, we ask that you please give us 2 business days to thoroughly review all your results before contacting the office for clarification. Should we see a critical lab result, you will be contacted sooner.   If You Need Anything After Your Visit  If you have any questions or concerns for your doctor, please call our main line at [phone] and press option 4 to reach your doctor's medical assistant. If no one answers, please leave a voicemail as directed and we will return your call as soon as possible. Messages left after 4 pm will be answered the following business day.   You may also send us a message via MyChart. We typically respond to MyChart messages within 1-2 business days.  For prescription refills, please ask your pharmacy to contact our office. Our fax number is [phone].  If you have an urgent issue when the clinic is closed that   cannot wait until the next business day, you can page your doctor at the number below.    Please note that while we do our best to be available for urgent issues outside of office hours, we are not available 24/7.   If you have an urgent issue and are unable to reach us, you may choose to seek medical care at your doctor's office, retail clinic, urgent care center, or emergency room.  If you have a medical emergency, please immediately call 911 or go to the emergency department.  Pager Numbers  - Dr. Kowalski: [phone]  - Dr. Moye: [phone]  - Dr. Stewart:  [phone]  In the event of inclement weather, please call our main line at [phone] for an update on the status of any delays or closures.  Dermatology Medication Tips: Please keep the boxes that topical medications come in in order to help keep track of the instructions about where and how to use these. Pharmacies typically print the medication instructions only on the boxes and not directly on the medication tubes.   If your medication is too expensive, please contact our office at [phone] option 4 or send us a message through MyChart.   We are unable to tell what your co-pay for medications will be in advance as this is different depending on your insurance coverage. However, we may be able to find a substitute medication at lower cost or fill out paperwork to get insurance to cover a needed medication.   If a prior authorization is required to get your medication covered by your insurance company, please allow us 1-2 business days to complete this process.  Drug prices often vary depending on where the prescription is filled and some pharmacies may offer cheaper prices.  The website www.goodrx.com contains coupons for medications through different pharmacies. The prices here do not account for what the cost may be with help from insurance (it may be cheaper with your insurance), but the website can give you the price if you did not use any insurance.  - You can print the associated coupon and take it with your prescription to the pharmacy.  - You may also stop by our office during regular business hours and pick up a GoodRx coupon card.  - If you need your prescription sent electronically to a different pharmacy, notify our office through [facility] MyChart or by phone at [phone] option 4.     Si Usted Necesita Algo Despus de Su Visita  Tambin puede enviarnos un mensaje a travs de MyChart. Por lo general respondemos a los mensajes de MyChart en el transcurso de 1 a 2  das hbiles.  Para renovar recetas, por favor pida a su farmacia que se ponga en contacto con nuestra oficina. Nuestro nmero de fax es el [phone].  Si tiene un asunto urgente cuando la clnica est cerrada y que no puede esperar hasta el siguiente da hbil, puede llamar/localizar a su doctor(a) al nmero que aparece a continuacin.   Por favor, tenga en cuenta que aunque hacemos todo lo posible para estar disponibles para asuntos urgentes fuera del horario de oficina, no estamos disponibles las 24 horas del da, los 7 das de la semana.   Si tiene un problema urgente y no puede comunicarse con nosotros, puede optar por buscar atencin mdica  en el consultorio de su doctor(a), en una clnica privada, en un centro de atencin urgente o en una sala de emergencias.  Si tiene una emergencia mdica, por favor   llame inmediatamente al 911 o vaya a la sala de emergencias.  Nmeros de bper  - Dr. Kowalski: [phone]  - Dra. Moye: [phone]  - Dra. Stewart: [phone]  En caso de inclemencias del tiempo, por favor llame a nuestra lnea principal al [phone] para una actualizacin sobre el estado de cualquier retraso o cierre.  Consejos para la medicacin en dermatologa: Por favor, guarde las cajas en las que vienen los medicamentos de uso tpico para ayudarle a seguir las instrucciones sobre dnde y cmo usarlos. Las farmacias generalmente imprimen las instrucciones del medicamento slo en las cajas y no directamente en los tubos del medicamento.   Si su medicamento es muy caro, por favor, pngase en contacto con nuestra oficina llamando al [phone] y presione la opcin 4 o envenos un mensaje a travs de MyChart.   No podemos decirle cul ser su copago por los medicamentos por adelantado ya que esto es diferente dependiendo de la cobertura de su seguro. Sin embargo, es posible que podamos encontrar un medicamento sustituto a menor costo o llenar un formulario para que el  seguro cubra el medicamento que se considera necesario.   Si se requiere una autorizacin previa para que su compaa de seguros cubra su medicamento, por favor permtanos de 1 a 2 das hbiles para completar este proceso.  Los precios de los medicamentos varan con frecuencia dependiendo del lugar de dnde se surte la receta y alguna farmacias pueden ofrecer precios ms baratos.  El sitio web www.goodrx.com tiene cupones para medicamentos de diferentes farmacias. Los precios aqu no tienen en cuenta lo que podra costar con la ayuda del seguro (puede ser ms barato con su seguro), pero el sitio web puede darle el precio si no utiliz ningn seguro.  - Puede imprimir el cupn correspondiente y llevarlo con su receta a la farmacia.  - Tambin puede pasar por nuestra oficina durante el horario de atencin regular y recoger una tarjeta de cupones de GoodRx.  - Si necesita que su receta se enve electrnicamente a una farmacia diferente, informe a nuestra oficina a travs de MyChart de [facility] o por telfono llamando al [phone] y presione la opcin 4.  

## 2022-10-19 NOTE — Progress Notes (Signed)
New Patient Visit   Subjective  Shelly Phillips is a 89 y.o. female who presents for the following: Spot of right cheek x 6-7 months - no pain or bleeding but it is itchy and it comes and goes. 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  Objective  Well appearing patient in no apparent distress; mood and affect are within normal limits. A focused examination was performed of the following areas: Face Relevant exam findings are noted in the Assessment and Plan.  Right medial cheek ~1.0 x 0.7 cm crusted papule with surrounding hypopigmentation       Right mid cheek 0.6 cm flat papule      Assessment & Plan   Neoplasm of uncertain behavior of skin (2) Right medial cheek  Skin / nail biopsy Type of biopsy: tangential   Informed consent: discussed and consent obtained   Timeout: patient name, date of birth, surgical site, and procedure verified   Procedure prep:  Patient was prepped and draped in usual sterile fashion Prep type:  Isopropyl alcohol Anesthesia: the lesion was anesthetized in a standard fashion   Anesthetic:  1% lidocaine w/ epinephrine 1-100,000 buffered w/ 8.4% NaHCO3 Instrument used: flexible razor blade   Hemostasis achieved with: pressure, aluminum chloride and electrodesiccation   Outcome: patient tolerated procedure well   Post-procedure details: sterile dressing applied and wound care instructions given   Dressing type: bandage and petrolatum    Specimen 1 - Surgical pathology Differential Diagnosis: BCC vs other  Check Margins: No  Right mid cheek  Epidermal / dermal shaving  Lesion diameter (cm):  0.6 Informed consent: discussed and consent obtained   Timeout: patient  name, date of birth, surgical site, and procedure verified   Procedure prep:  Patient was prepped and draped in usual sterile fashion Prep type:  Isopropyl alcohol Anesthesia: the lesion was anesthetized in a standard fashion   Anesthetic:  1% lidocaine w/ epinephrine 1-100,000 buffered w/ 8.4% NaHCO3 Instrument used: flexible razor blade   Hemostasis achieved with: pressure, aluminum chloride and electrodesiccation   Outcome: patient tolerated procedure well   Post-procedure details: sterile dressing applied and wound care instructions given   Dressing type: bandage and petrolatum    Specimen 2 - Surgical pathology Differential Diagnosis: Nevus vs BCC vs other  Check Margins: No  ACTINIC DAMAGE - chronic, secondary to cumulative UV radiation exposure/sun exposure over time - diffuse scaly erythematous macules with underlying dyspigmentation - Recommend daily broad spectrum sunscreen SPF 30+ to sun-exposed areas, reapply every 2 hours as needed.  - Recommend staying in the shade or wearing long sleeves, sun glasses (UVA+UVB protection) and wide brim hats (4-inch brim around the entire circumference of the hat). - Call for new or changing lesions.  Rosacea Rosacea is a chronic progressive skin condition usually affecting the face of adults, causing redness and/or acne bumps. It is treatable but not curable. It sometimes affects the eyes (ocular rosacea) as well. It may respond to topical and/or systemic medication and can flare with stress, sun exposure, alcohol, exercise, topical steroids (including hydrocortisone/cortisone 10) and some foods.  Daily application of broad spectrum spf 30+ sunscreen to face is recommended to reduce flares. Counseling for BBL / IPL /  Laser and Coordination of Care Discussed the treatment option of Broad Band Light (BBL) /Intense Pulsed Light (IPL)/ Laser for skin discoloration, including brown spots and redness.  Typically we recommend at least 1-3 treatment  sessions about 5-8 weeks apart for best results.  Cannot have tanned skin when BBL performed, and regular use of sunscreen is advised after the procedure to help maintain results. The patient's condition may also require "maintenance treatments" in the future.  The fee for BBL / laser treatments is $350 per treatment session for the whole face.  A fee can be quoted for other parts of the body.  Insurance typically does not pay for BBL/laser treatments and therefore the fee is an out-of-pocket cost.  Return if symptoms worsen or fail to improve.  I, Joanie Coddington, CMA, am acting as scribe for Armida Sans, MD .  Documentation: I have reviewed the above documentation for accuracy and completeness, and I agree with the above.  Armida Sans, MD

## 2022-10-21 ENCOUNTER — Encounter: Payer: Self-pay | Admitting: Dermatology

## 2022-10-26 ENCOUNTER — Telehealth: Payer: Self-pay

## 2022-10-26 DIAGNOSIS — C44319 Basal cell carcinoma of skin of other parts of face: Secondary | ICD-10-CM

## 2022-10-26 NOTE — Telephone Encounter (Signed)
-----   Message from Deirdre Evener, MD sent at 10/25/2022  5:51 PM EDT ----- Diagnosis 1. Skin , right medial cheek SUPERFICIAL AND NODULAR BASAL CELL CARCINOMA, CRUSTED 2. Skin , right mid cheek MELANOCYTIC NEVUS, INTRADERMAL TYPE, IRRITATED  1- Cancer - BCC Schedule for MOHs 2- Benign irritated mole No further treatment needed

## 2022-10-26 NOTE — Telephone Encounter (Signed)
Advised pt of bx results.  Referral emailed to Dr. Winfield Cunas

## 2023-04-26 ENCOUNTER — Encounter: Payer: Self-pay | Admitting: Dermatology

## 2023-04-26 ENCOUNTER — Ambulatory Visit: Payer: Medicare Other | Admitting: Dermatology

## 2023-04-26 DIAGNOSIS — D492 Neoplasm of unspecified behavior of bone, soft tissue, and skin: Secondary | ICD-10-CM

## 2023-04-26 DIAGNOSIS — C44319 Basal cell carcinoma of skin of other parts of face: Secondary | ICD-10-CM | POA: Diagnosis not present

## 2023-04-26 DIAGNOSIS — C44321 Squamous cell carcinoma of skin of nose: Secondary | ICD-10-CM | POA: Diagnosis not present

## 2023-04-26 DIAGNOSIS — W908XXA Exposure to other nonionizing radiation, initial encounter: Secondary | ICD-10-CM

## 2023-04-26 DIAGNOSIS — L578 Other skin changes due to chronic exposure to nonionizing radiation: Secondary | ICD-10-CM

## 2023-04-26 DIAGNOSIS — C44320 Squamous cell carcinoma of skin of unspecified parts of face: Secondary | ICD-10-CM

## 2023-04-26 DIAGNOSIS — Z85828 Personal history of other malignant neoplasm of skin: Secondary | ICD-10-CM

## 2023-04-26 NOTE — Progress Notes (Signed)
Follow-Up Visit   Subjective  Shelly Phillips is a 89 y.o. female who presents for the following: Spot on nose.  Dur: 6 weeks. Sensitive to touch. Picked at area and caused some bleeding.   Bx proven BCC, nodular and superficial, at right medial cheek. Bx: 10/19/2022. Patient states she did not have Mohs surgery with Dr. Adriana Simas. She states the area healed well and cleared up. She called Dr. Patsey Berthold office and cancelled appointment.  The patient has spots, moles and lesions to be evaluated, some may be new or changing and the patient may have concern these could be cancer.  This patient is accompanied in the office by her  friend, Shelly Phillips .   The following portions of the chart were reviewed this encounter and updated as appropriate: medications, allergies, medical history  Review of Systems:  No other skin or systemic complaints except as noted in HPI or Assessment and Plan.  Objective  Well appearing patient in no apparent distress; mood and affect are within normal limits.  A focused examination was performed of the following areas: Face, nose  Relevant physical exam findings are noted in the Assessment and Plan.  Left Supratip of Nose 1.2 cm raised nodule with central ulceration           Assessment & Plan   Neoplasm of skin Left Supratip of Nose  Epidermal / dermal shaving  Lesion diameter (cm):  1.2 Informed consent: discussed and consent obtained   Timeout: patient name, date of birth, surgical site, and procedure verified   Procedure prep:  Patient was prepped and draped in usual sterile fashion Prep type:  Isopropyl alcohol Anesthesia: the lesion was anesthetized in a standard fashion   Anesthetic:  1% lidocaine w/ epinephrine 1-100,000 buffered w/ 8.4% NaHCO3 Instrument used: flexible razor blade   Hemostasis achieved with: pressure, aluminum chloride and electrodesiccation   Outcome: patient tolerated procedure well   Post-procedure details: sterile  dressing applied and wound care instructions given   Dressing type: bandage and petrolatum    Destruction of lesion Complexity: extensive   Destruction method: electrodesiccation and curettage   Informed consent: discussed and consent obtained   Timeout:  patient name, date of birth, surgical site, and procedure verified Procedure prep:  Patient was prepped and draped in usual sterile fashion Prep type:  Isopropyl alcohol Anesthesia: the lesion was anesthetized in a standard fashion   Anesthetic:  1% lidocaine w/ epinephrine 1-100,000 buffered w/ 8.4% NaHCO3 Curettage performed in three different directions: Yes   Electrodesiccation performed over the curetted area: Yes   Curettage cycles:  3 Lesion length (cm):  1.2 Lesion width (cm):  1.2 Final wound size (cm):  1.4 Hemostasis achieved with:  pressure and aluminum chloride Outcome: patient tolerated procedure well with no complications   Post-procedure details: sterile dressing applied and wound care instructions given   Dressing type: bandage and petrolatum    Specimen 1 - Surgical pathology Differential Diagnosis: R/O BCC vs SCC  Check Margins: No EDC treatment today   Basal Cell Carcinoma Exam: pink smooth macule at right medial cheek. Treatment:  Watch closely for recurrence. Pt cancelled Mohs surgery appt.  ACTINIC DAMAGE - chronic, secondary to cumulative UV radiation exposure/sun exposure over time - diffuse scaly erythematous macules with underlying dyspigmentation - Recommend daily broad spectrum sunscreen SPF 30+ to sun-exposed areas, reapply every 2 hours as needed.  - Recommend staying in the shade or wearing long sleeves, sun glasses (UVA+UVB protection) and wide brim hats (  4-inch brim around the entire circumference of the hat). - Call for new or changing lesions.  Return in about 6 months (around 10/25/2023) for Biopsy Follow Up.  I, Lawson Radar, CMA, am acting as scribe for Armida Sans,  MD.   Documentation: I have reviewed the above documentation for accuracy and completeness, and I agree with the above.  Armida Sans, MD

## 2023-04-26 NOTE — Patient Instructions (Addendum)
Wound Care Instructions  Cleanse wound gently with soap and water once a day then pat dry with clean gauze. Apply a thin coat of Petrolatum (petroleum jelly, "Vaseline") over the wound (unless you have an allergy to this). We recommend that you use a new, sterile tube of Vaseline. Do not pick or remove scabs. Do not remove the yellow or white "healing tissue" from the base of the wound.  Cover the wound with fresh, clean, nonstick gauze and secure with paper tape. You may use Band-Aids in place of gauze and tape if the wound is small enough, but would recommend trimming much of the tape off as there is often too much. Sometimes Band-Aids can irritate the skin.  You should call the office for your biopsy report after 1 week if you have not already been contacted.  If you experience any problems, such as abnormal amounts of bleeding, swelling, significant bruising, significant pain, or evidence of infection, please call the office immediately.  FOR ADULT SURGERY PATIENTS: If you need something for pain relief you may take 1 extra strength Tylenol (acetaminophen) AND 2 Ibuprofen (200mg  each) together every 4 hours as needed for pain. (do not take these if you are allergic to them or if you have a reason you should not take them.) Typically, you may only need pain medication for 1 to 3 days.    Recommend daily broad spectrum sunscreen SPF 30+ to sun-exposed areas, reapply every 2 hours as needed. Call for new or changing lesions.  Staying in the shade or wearing long sleeves, sun glasses (UVA+UVB protection) and wide brim hats (4-inch brim around the entire circumference of the hat) are also recommended for sun protection.    Due to recent changes in healthcare laws, you may see results of your pathology and/or laboratory studies on MyChart before the doctors have had a chance to review them. We understand that in some cases there may be results that are confusing or concerning to you. Please understand  that not all results are received at the same time and often the doctors may need to interpret multiple results in order to provide you with the best plan of care or course of treatment. Therefore, we ask that you please give [place] 2 business days to thoroughly review all your results before contacting the office for clarification. Should we see a critical lab result, you will be contacted sooner.   If You Need Anything After Your Visit  If you have any questions or concerns for your doctor, please call our main line at [phone] and press option 4 to reach your doctor's medical assistant. If no one answers, please leave a voicemail as directed and we will return your call as soon as possible. Messages left after 4 pm will be answered the following business day.   You may also send [place] a message via MyChart. We typically respond to MyChart messages within 1-2 business days.  For prescription refills, please ask your pharmacy to contact our office. Our fax number is [phone].  If you have an urgent issue when the clinic is closed that cannot wait until the next business day, you can page your doctor at the number below.    Please note that while we do our best to be available for urgent issues outside of office hours, we are not available 24/7.   If you have an urgent issue and are unable to reach [place], you may choose to seek medical care at your doctor's office,  retail clinic, urgent care center, or emergency room.  If you have a medical emergency, please immediately call 911 or go to the emergency department.  Pager Numbers  - Dr. Gwen Pounds: [phone]  - Dr. Roseanne Reno: [phone]  - Dr. Katrinka Blazing: [phone]   In the event of inclement weather, please call our main line at [phone] for an update on the status of any delays or closures.  Dermatology Medication Tips: Please keep the boxes that topical medications come in in order to help keep track of the instructions about where  and how to use these. Pharmacies typically print the medication instructions only on the boxes and not directly on the medication tubes.   If your medication is too expensive, please contact our office at [phone] option 4 or send [place] a message through MyChart.   We are unable to tell what your co-pay for medications will be in advance as this is different depending on your insurance coverage. However, we may be able to find a substitute medication at lower cost or fill out paperwork to get insurance to cover a needed medication.   If a prior authorization is required to get your medication covered by your insurance company, please allow [place] 1-2 business days to complete this process.  Drug prices often vary depending on where the prescription is filled and some pharmacies may offer cheaper prices.  The website www.goodrx.com contains coupons for medications through different pharmacies. The prices here do not account for what the cost may be with help from insurance (it may be cheaper with your insurance), but the website can give you the price if you did not use any insurance.  - You can print the associated coupon and take it with your prescription to the pharmacy.  - You may also stop by our office during regular business hours and pick up a GoodRx coupon card.  - If you need your prescription sent electronically to a different pharmacy, notify our office through [facility] or by phone at [phone] option 4.     Si Usted Necesita Algo Despus de Su Visita  Tambin puede enviarnos un mensaje a travs de Clinical cytogeneticist. Por lo general respondemos a los mensajes de MyChart en el transcurso de 1 a 2 das hbiles.  Para renovar recetas, por favor pida a su farmacia que se ponga en contacto con nuestra oficina. Annie Sable de fax es [place] [phone].  Si tiene un asunto urgente cuando la clnica est cerrada y que no puede esperar hasta el siguiente da hbil, puede llamar/localizar a  su doctor(a) al nmero que aparece a continuacin.   Por favor, tenga en cuenta que aunque hacemos todo lo posible para estar disponibles para asuntos urgentes fuera del horario de [place], no estamos disponibles las 24 horas del da, los 7 [address]  [address] de la [place].   Si tiene un problema urgente y no puede comunicarse con nosotros, puede optar por buscar atencin mdica  en el consultorio de su doctor(a), en una clnica privada, en un centro de atencin urgente o en una sala de emergencias.  Si tiene Engineer, drilling, por favor llame inmediatamente al 911 o vaya a la sala de emergencias.  Nmeros de bper  - Dr. Gwen Pounds: [phone]  - Dra. Roseanne Reno: [phone]  - Dr. Katrinka Blazing: [phone]   En caso de inclemencias del tiempo, por favor llame a Lacy Duverney principal al [phone] para una actualizacin sobre el [place] de cualquier retraso o cierre.  Consejos para la medicacin en dermatologa: Por  favor, guarde las cajas en las que vienen los medicamentos de uso tpico para ayudarle a seguir las instrucciones sobre dnde y cmo usarlos. Las farmacias generalmente imprimen las instrucciones del medicamento slo en las cajas y no directamente en los tubos del [place].   Si su medicamento es muy caro, por favor, pngase en contacto con Rolm Gala llamando al [phone] y presione la opcin 4 o envenos un mensaje a travs de Clinical cytogeneticist.   No podemos decirle cul ser su copago por los medicamentos por adelantado ya que esto es diferente dependiendo de la cobertura de su seguro. Sin embargo, es posible que podamos encontrar un medicamento sustituto a Audiological scientist un formulario para que el seguro cubra el medicamento que se considera necesario.   Si se requiere una autorizacin previa para que su compaa de seguros [place] su medicamento, por favor permtanos de 1 a 2 das hbiles para completar [address]  Los precios de los medicamentos varan con frecuencia dependiendo  del Environmental consultant de dnde se surte la receta y alguna farmacias pueden ofrecer precios ms baratos.  El sitio web www.goodrx.com tiene cupones para medicamentos de Health and safety inspector. Los precios aqu no tienen en cuenta lo que podra costar con la ayuda del seguro (puede ser ms barato con su seguro), pero el sitio web puede darle el precio si no utiliz Tourist information centre manager.  - Puede imprimir el cupn correspondiente y llevarlo con su receta a la farmacia.  - Tambin puede pasar por nuestra oficina durante el horario de atencin regular y Education officer, museum una tarjeta de cupones de GoodRx.  - Si necesita que su receta se enve electrnicamente a una farmacia diferente, informe a nuestra oficina a travs de MyChart de [place] o por telfono llamando al [phone] y presione la opcin 4.

## 2023-04-28 LAB — SURGICAL PATHOLOGY

## 2023-05-01 ENCOUNTER — Telehealth: Payer: Self-pay

## 2023-05-01 NOTE — Telephone Encounter (Signed)
-----   Message from Shelly Phillips sent at 05/01/2023  5:28 PM EDT ----- FINAL DIAGNOSIS        1. Skin, Left supratip of nose :       WELL DIFFERENTIATED SQUAMOUS CELL CARCINOMA   Cancer = SCC Already treated Recheck next visit

## 2023-05-01 NOTE — Telephone Encounter (Signed)
Advised pt of bx result/sh ?

## 2023-05-02 ENCOUNTER — Encounter: Payer: Self-pay | Admitting: Dermatology

## 2023-10-02 ENCOUNTER — Other Ambulatory Visit
Admission: RE | Admit: 2023-10-02 | Discharge: 2023-10-02 | Disposition: A | Discharge status: Home / Self Care | Source: Ambulatory Visit | Attending: Family Medicine | Admitting: Family Medicine

## 2023-10-02 DIAGNOSIS — N1832 Chronic kidney disease, stage 3b: Secondary | ICD-10-CM | POA: Diagnosis present

## 2023-10-02 DIAGNOSIS — E66812 Obesity, class 2: Secondary | ICD-10-CM | POA: Diagnosis present

## 2023-10-02 DIAGNOSIS — Z6837 Body mass index (BMI) 37.0-37.9, adult: Secondary | ICD-10-CM | POA: Insufficient documentation

## 2023-10-02 DIAGNOSIS — I10 Essential (primary) hypertension: Secondary | ICD-10-CM | POA: Diagnosis present

## 2023-10-02 DIAGNOSIS — R7303 Prediabetes: Secondary | ICD-10-CM | POA: Diagnosis present

## 2023-10-02 DIAGNOSIS — D72829 Elevated white blood cell count, unspecified: Secondary | ICD-10-CM | POA: Insufficient documentation

## 2023-10-02 LAB — CBC WITH DIFFERENTIAL/PLATELET
Abs Immature Granulocytes: 0.04 10*3/uL (ref 0.00–0.07)
Basophils Absolute: 0.1 10*3/uL (ref 0.0–0.1)
Basophils Relative: 1 %
Eosinophils Absolute: 0.2 10*3/uL (ref 0.0–0.5)
Eosinophils Relative: 1 %
HCT: 45.3 % (ref 36.0–46.0)
Hemoglobin: 14.9 g/dL (ref 12.0–15.0)
Immature Granulocytes: 0 %
Lymphocytes Relative: 53 %
Lymphs Abs: 8.7 10*3/uL — ABNORMAL HIGH (ref 0.7–4.0)
MCH: 31.1 pg (ref 26.0–34.0)
MCHC: 32.9 g/dL (ref 30.0–36.0)
MCV: 94.6 fL (ref 80.0–100.0)
Monocytes Absolute: 0.9 10*3/uL (ref 0.1–1.0)
Monocytes Relative: 6 %
Neutro Abs: 6.4 10*3/uL (ref 1.7–7.7)
Neutrophils Relative %: 39 %
Platelets: 177 10*3/uL (ref 150–400)
RBC: 4.79 MIL/uL (ref 3.87–5.11)
RDW: 14.5 % (ref 11.5–15.5)
Smear Review: NORMAL
WBC: 16.3 10*3/uL — ABNORMAL HIGH (ref 4.0–10.5)
nRBC: 0 % (ref 0.0–0.2)

## 2023-10-03 LAB — PATHOLOGIST SMEAR REVIEW

## 2023-10-06 ENCOUNTER — Encounter: Payer: Self-pay | Admitting: Oncology

## 2023-10-06 ENCOUNTER — Inpatient Hospital Stay

## 2023-10-06 ENCOUNTER — Inpatient Hospital Stay: Attending: Oncology | Admitting: Oncology

## 2023-10-06 VITALS — BP 166/94 | HR 68 | Temp 97.2°F | Resp 18 | Wt 234.5 lb

## 2023-10-06 DIAGNOSIS — Z87440 Personal history of urinary (tract) infections: Secondary | ICD-10-CM | POA: Insufficient documentation

## 2023-10-06 DIAGNOSIS — Z85828 Personal history of other malignant neoplasm of skin: Secondary | ICD-10-CM | POA: Diagnosis not present

## 2023-10-06 DIAGNOSIS — D72820 Lymphocytosis (symptomatic): Secondary | ICD-10-CM

## 2023-10-06 DIAGNOSIS — Z79899 Other long term (current) drug therapy: Secondary | ICD-10-CM | POA: Diagnosis not present

## 2023-10-06 DIAGNOSIS — Z87891 Personal history of nicotine dependence: Secondary | ICD-10-CM | POA: Insufficient documentation

## 2023-10-06 DIAGNOSIS — N1832 Chronic kidney disease, stage 3b: Secondary | ICD-10-CM

## 2023-10-06 DIAGNOSIS — N183 Chronic kidney disease, stage 3 unspecified: Secondary | ICD-10-CM | POA: Diagnosis not present

## 2023-10-06 DIAGNOSIS — E669 Obesity, unspecified: Secondary | ICD-10-CM | POA: Diagnosis not present

## 2023-10-06 DIAGNOSIS — I129 Hypertensive chronic kidney disease with stage 1 through stage 4 chronic kidney disease, or unspecified chronic kidney disease: Secondary | ICD-10-CM | POA: Diagnosis not present

## 2023-10-06 LAB — CBC WITH DIFFERENTIAL/PLATELET
Abs Immature Granulocytes: 0.05 10*3/uL (ref 0.00–0.07)
Basophils Absolute: 0.1 10*3/uL (ref 0.0–0.1)
Basophils Relative: 1 %
Eosinophils Absolute: 0.2 10*3/uL (ref 0.0–0.5)
Eosinophils Relative: 1 %
HCT: 43.9 % (ref 36.0–46.0)
Hemoglobin: 14.2 g/dL (ref 12.0–15.0)
Immature Granulocytes: 0 %
Lymphocytes Relative: 61 %
Lymphs Abs: 9.5 10*3/uL — ABNORMAL HIGH (ref 0.7–4.0)
MCH: 30.3 pg (ref 26.0–34.0)
MCHC: 32.3 g/dL (ref 30.0–36.0)
MCV: 93.6 fL (ref 80.0–100.0)
Monocytes Absolute: 0.5 10*3/uL (ref 0.1–1.0)
Monocytes Relative: 3 %
Neutro Abs: 5.4 10*3/uL (ref 1.7–7.7)
Neutrophils Relative %: 34 %
Platelets: 174 10*3/uL (ref 150–400)
RBC: 4.69 MIL/uL (ref 3.87–5.11)
RDW: 14.3 % (ref 11.5–15.5)
Smear Review: NORMAL
WBC: 15.7 10*3/uL — ABNORMAL HIGH (ref 4.0–10.5)
nRBC: 0 % (ref 0.0–0.2)

## 2023-10-06 LAB — LACTATE DEHYDROGENASE: LDH: 118 U/L (ref 98–192)

## 2023-10-06 NOTE — Assessment & Plan Note (Signed)
 Encourage oral hydration and avoid nephrotoxins.  Check SPEP, light chain ratio

## 2023-10-06 NOTE — Progress Notes (Signed)
 Hematology/Oncology Consult note [phone] [phone]        REFERRING PROVIDER: Marisue Ivan, MD   CHIEF COMPLAINTS/REASON FOR VISIT:  Evaluation of lymphocytosis   ASSESSMENT & PLAN:   Lymphocytosis Labs were reviewed and discussed with patient that Leukocytosis, differential diagnosis is broad, acute or chronic infection and inflammation, smoking, autoimmune disease, or underlying bone marrow disorders, ie CLL.   For the work up of patient's leukocytosis, I recommend checking CBC;CMP, LDH, smear review, peripheral flowcytometry, hepatitis, monoclonal gammopathy workup.    CKD (chronic kidney disease) stage 3, GFR 30-59 ml/min (HCC) Encourage oral hydration and avoid nephrotoxins.  Check SPEP, light chain ratio   Orders Placed This Encounter  Procedures   CBC with Differential/Platelet    Standing Status:   Future    Number of Occurrences:   1    Expected Date:   10/06/2023    Expiration Date:   10/05/2024   Lactate dehydrogenase    Standing Status:   Future    Number of Occurrences:   1    Expected Date:   10/06/2023    Expiration Date:   10/05/2024   Flow cytometry panel-leukemia/lymphoma work-up    Standing Status:   Future    Number of Occurrences:   1    Expected Date:   10/06/2023    Expiration Date:   10/05/2024   Multiple Myeloma Panel (SPEP&IFE w/QIG)    Standing Status:   Future    Number of Occurrences:   1    Expected Date:   10/06/2023    Expiration Date:   10/05/2024   Kappa/lambda light chains    Standing Status:   Future    Number of Occurrences:   1    Expected Date:   10/06/2023    Expiration Date:   10/05/2024   Hepatitis panel, acute    Standing Status:   Future    Number of Occurrences:   1    Expected Date:   10/06/2023    Expiration Date:   10/05/2024   Follow up in a few weeks to review results.  All questions were answered. The patient knows to call the clinic with any problems, questions or concerns.  Rickard Patience,  MD, PhD [facility] Hematology Oncology 10/06/2023   HISTORY OF PRESENTING ILLNESS:   Shelly Phillips is a  89 y.o.  female with PMH listed below was seen in consultation at the request of  Marisue Ivan, MD  for evaluation of lymphocytosis  She has a history of elevated white blood cell count, first noted in 2017, with a total white blood cell count of 14. 10/02/2023 cbc showed wbc 16.3, predominately lymphocytosis, ALC 8.7.  as noted in recent blood work. Smear showed raising suspicion for chronic lymphocytic leukemia (CLL).  No unintentional weight loss, night sweats, or low-grade fever. She experiences fatigue and occasional weakness, which she attributes to aging. She has a variable appetite and numbness in her fingers.    MEDICAL HISTORY:  Past Medical History:  Diagnosis Date   Basal cell carcinoma 04/14/2014   nose - Mohs Dr Adriana Simas   Basal cell carcinoma 10/19/2022   Right medial cheek, needs mohs. Patient states she did not have Mohs surgery with Dr. Adriana Simas. She states the area healed well and cleared up. She called Dr. Patsey Berthold office and cancelled appointment.   HLD (hyperlipidemia)    Hypertension    Macular degeneration    Obesity    Pre-diabetes  Rosacea    Skin cancer    Squamous cell carcinoma of skin 05/23/2018   right midline mid dorsum nose - [facility]   Squamous cell carcinoma of skin 04/26/2023   Left Supratip of Nose, [facility]   Urinary tract infection     SURGICAL HISTORY: Past Surgical History:  Procedure Laterality Date   ABDOMINAL HYSTERECTOMY     CATARACT EXTRACTION W/PHACO Right 11/29/2016   Procedure: CATARACT EXTRACTION PHACO AND INTRAOCULAR LENS PLACEMENT (IOC);  Surgeon: Galen Manila, MD;  Location: [facility] ORS;  Service: Ophthalmology;  Laterality: Right;  [place] 49.8 AP% 21.9 CDE 10.89 Fluid Pack Lot # [id number]   FACIAL RECONSTRUCTION SURGERY     FRACTURE SURGERY      SOCIAL HISTORY: Social History   Socioeconomic History   Marital status:  Widowed    Spouse name: Not on file   Number of children: Not on file   Years of education: Not on file   Highest education level: Not on file  Occupational History   Not on file  Tobacco Use   Smoking status: Former    Current packs/day: 0.00    Types: Cigarettes    Quit date: 1980    Years since quitting: 45.2   Smokeless tobacco: Never  Substance and Sexual Activity   Alcohol use: No   Drug use: No   Sexual activity: Not on file  Other Topics Concern   Not on file  Social History Narrative   Not on file   Social Drivers of Health   Financial Resource Strain: Low Risk  (10/02/2023)   Received from [facility]   Overall Financial Resource Strain (CARDIA)    Difficulty of Paying Living Expenses: Not hard at all  Food Insecurity: No Food Insecurity (10/06/2023)   Hunger Vital Sign    Worried About Running Out of Food in the Last Year: Never true    Ran Out of Food in the Last Year: Never true  Transportation Needs: No Transportation Needs (10/06/2023)   PRAPARE - Administrator, Civil Service (Medical): No    Lack of Transportation (Non-Medical): No  Physical Activity: Not on file  Stress: Not on file  Social Connections: Not on file  Intimate Partner Violence: Not At Risk (10/06/2023)   Humiliation, Afraid, Rape, and Kick questionnaire    Fear of Current or Ex-Partner: No    Emotionally Abused: No    Physically Abused: No    Sexually Abused: No    FAMILY HISTORY: Family History  Problem Relation Age of Onset   Parkinson's disease Mother    CAD Father    Cancer Brother        brain tumor    ALLERGIES:  is allergic to demerol [meperidine].  MEDICATIONS:  Current Outpatient Medications  Medication Sig Dispense Refill   acetaminophen (TYLENOL) 500 MG tablet Take 1,000 mg by mouth at bedtime.     aspirin EC 81 MG tablet Take 1 tablet by mouth daily.     atorvastatin (LIPITOR) 20 MG tablet Take 20 mg by mouth daily.     metoprolol  tartrate (LOPRESSOR) 25 MG tablet Take 1 tablet (25 mg total) by mouth 2 (two) times daily. 60 tablet 0   Multiple Vitamin (MULTI-VITAMIN) tablet Take 1 tablet by mouth daily.     Multiple Vitamins-Minerals (PRESERVISION AREDS PO) Take by mouth.     sodium chloride (OCEAN) 0.65 % SOLN nasal spray Place 2 sprays into both nostrils 2 (two) times daily.  triamcinolone (NASACORT ALLERGY 24HR) 55 MCG/ACT AERO nasal inhaler Place 1 spray into the nose 2 (two) times daily.     No current facility-administered medications for this visit.    Review of Systems  Constitutional:  Positive for fatigue. Negative for appetite change, chills, fever and unexpected weight change.  HENT:   Negative for hearing loss and voice change.   Eyes:  Negative for eye problems.  Respiratory:  Negative for chest tightness and cough.   Cardiovascular:  Negative for chest pain.  Gastrointestinal:  Negative for abdominal distention, abdominal pain and blood in stool.  Endocrine: Negative for hot flashes.  Genitourinary:  Negative for difficulty urinating and frequency.   Musculoskeletal:  Negative for arthralgias.  Skin:  Negative for itching and rash.  Neurological:  Positive for numbness. Negative for extremity weakness.  Hematological:  Negative for adenopathy.  Psychiatric/Behavioral:  Negative for confusion.    PHYSICAL EXAMINATION: ECOG PERFORMANCE STATUS: 1 - Symptomatic but completely ambulatory Vitals:   10/06/23 1120 10/06/23 1121  BP: (!) 161/105 (!) 166/94  Pulse: 68   Resp: 18   Temp: (!) 97.2 F (36.2 C)    Filed Weights   10/06/23 1120  Weight: 234 lb 8 oz (106.4 kg)    Physical Exam Constitutional:      General: She is not in acute distress. HENT:     Head: Normocephalic and atraumatic.  Eyes:     General: No scleral icterus. Cardiovascular:     Rate and Rhythm: Normal rate and regular rhythm.     Heart sounds: Normal heart sounds.  Pulmonary:     Effort: Pulmonary effort is  normal. No respiratory distress.     Breath sounds: No wheezing.  Abdominal:     General: Bowel sounds are normal. There is no distension.     Palpations: Abdomen is soft.  Musculoskeletal:        General: No deformity. Normal range of motion.     Cervical back: Normal range of motion and neck supple.  Skin:    Findings: No erythema or rash.  Neurological:     Mental Status: She is alert and oriented to person, place, and time. Mental status is at baseline.  Psychiatric:        Mood and Affect: Mood normal.     LABORATORY DATA:  I have reviewed the data as listed    Latest Ref Rng & Units 10/06/2023   11:56 AM 10/02/2023    3:10 PM 11/06/2016    4:35 PM  CBC  WBC 4.0 - 10.5 K/uL 15.7  16.3  18.3   Hemoglobin 12.0 - 15.0 g/dL 91.4  78.2  95.6   Hematocrit 36.0 - 46.0 % 43.9  45.3  36.3   Platelets 150 - 400 K/uL 174  177  168       Latest Ref Rng & Units 11/06/2016    4:35 PM 03/17/2016    4:07 AM 03/16/2016    4:41 AM  CMP  Glucose 65 - 99 mg/dL 213  086  578   BUN 6 - 20 mg/dL 13  31  58   Creatinine 0.44 - 1.00 mg/dL 4.69  6.29  5.28   Sodium 135 - 145 mmol/L 133  139  136   Potassium 3.5 - 5.1 mmol/L 2.9  3.9  3.2   Chloride 101 - 111 mmol/L 101  109  104   CO2 22 - 32 mmol/L 22  23  24    Calcium 8.9 -  10.3 mg/dL 8.4  8.4  8.5   Total Protein 6.5 - 8.1 g/dL 6.9  6.5    Total Bilirubin 0.3 - 1.2 mg/dL 1.3  0.5    Alkaline Phos 38 - 126 U/L 89  178    AST 15 - 41 U/L 15  126    ALT 14 - 54 U/L 13  127        RADIOGRAPHIC STUDIES: I have personally reviewed the radiological images as listed and agreed with the findings in the report. No results found.

## 2023-10-06 NOTE — Assessment & Plan Note (Signed)
 Labs were reviewed and discussed with patient that Leukocytosis, differential diagnosis is broad, acute or chronic infection and inflammation, smoking, autoimmune disease, or underlying bone marrow disorders, ie CLL.   For the work up of patient's leukocytosis, I recommend checking CBC;CMP, LDH, smear review, peripheral flowcytometry, hepatitis, monoclonal gammopathy workup.

## 2023-10-07 LAB — HEPATITIS PANEL, ACUTE
HCV Ab: NONREACTIVE
Hep A IgM: NONREACTIVE
Hep B C IgM: NONREACTIVE
Hepatitis B Surface Ag: NONREACTIVE

## 2023-10-09 LAB — KAPPA/LAMBDA LIGHT CHAINS
Kappa free light chain: 61.6 mg/L — ABNORMAL HIGH (ref 3.3–19.4)
Kappa, lambda light chain ratio: 2.74 — ABNORMAL HIGH (ref 0.26–1.65)
Lambda free light chains: 22.5 mg/L (ref 5.7–26.3)

## 2023-10-10 LAB — MULTIPLE MYELOMA PANEL, SERUM
Albumin SerPl Elph-Mcnc: 3.4 g/dL (ref 2.9–4.4)
Albumin/Glob SerPl: 1.1 (ref 0.7–1.7)
Alpha 1: 0.2 g/dL (ref 0.0–0.4)
Alpha2 Glob SerPl Elph-Mcnc: 0.8 g/dL (ref 0.4–1.0)
B-Globulin SerPl Elph-Mcnc: 1 g/dL (ref 0.7–1.3)
Gamma Glob SerPl Elph-Mcnc: 1.4 g/dL (ref 0.4–1.8)
Globulin, Total: 3.4 g/dL (ref 2.2–3.9)
IgA: 270 mg/dL (ref 64–422)
IgG (Immunoglobin G), Serum: 1435 mg/dL (ref 586–1602)
IgM (Immunoglobulin M), Srm: 132 mg/dL (ref 26–217)
M Protein SerPl Elph-Mcnc: 0.4 g/dL — ABNORMAL HIGH
Total Protein ELP: 6.8 g/dL (ref 6.0–8.5)

## 2023-10-10 LAB — COMP PANEL: LEUKEMIA/LYMPHOMA: Immunophenotypic Profile: 48

## 2023-10-20 ENCOUNTER — Encounter: Payer: Self-pay | Admitting: Oncology

## 2023-10-20 ENCOUNTER — Inpatient Hospital Stay: Attending: Oncology | Admitting: Oncology

## 2023-10-20 VITALS — BP 147/78 | HR 71 | Temp 96.9°F | Resp 18 | Wt 229.9 lb

## 2023-10-20 DIAGNOSIS — D472 Monoclonal gammopathy: Secondary | ICD-10-CM | POA: Insufficient documentation

## 2023-10-20 DIAGNOSIS — Z79899 Other long term (current) drug therapy: Secondary | ICD-10-CM | POA: Diagnosis not present

## 2023-10-20 DIAGNOSIS — N183 Chronic kidney disease, stage 3 unspecified: Secondary | ICD-10-CM | POA: Insufficient documentation

## 2023-10-20 DIAGNOSIS — C9110 Chronic lymphocytic leukemia of B-cell type not having achieved remission: Secondary | ICD-10-CM | POA: Diagnosis present

## 2023-10-20 DIAGNOSIS — N1832 Chronic kidney disease, stage 3b: Secondary | ICD-10-CM

## 2023-10-20 DIAGNOSIS — D72820 Lymphocytosis (symptomatic): Secondary | ICD-10-CM

## 2023-10-20 NOTE — Assessment & Plan Note (Signed)
 Encourage oral hydration and avoid nephrotoxins.

## 2023-10-20 NOTE — Assessment & Plan Note (Addendum)
 Rai stage 0 CLL, diagnosis was discussed with patient. 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  No cytopenia I recommend watchful waiting.

## 2023-10-20 NOTE — Assessment & Plan Note (Signed)
 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 Less frequently, these patients progress to AL amyloidosis, light chain deposition disease, or another lymphoproliferative disorder.  I recommend observation.

## 2023-10-20 NOTE — Progress Notes (Signed)
 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  No cytopenia I recommend watchful waiting.  CKD (chronic kidney disease) stage 3, GFR 30-59 ml/min (HCC) Encourage oral hydration and avoid nephrotoxins.    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 Less frequently, these patients progress to AL amyloidosis, light chain deposition disease, or another lymphoproliferative disorder.  I recommend observation.       Orders Placed This Encounter  Procedures   CMP (Cancer Center only)    Standing Status:   Future    Expected Date:   02/19/2024    Expiration Date:   10/19/2024   CBC with Differential (Cancer Center Only)    Standing Status:   Future    Expected Date:   02/19/2024    Expiration Date:   10/19/2024   Lactate dehydrogenase    Standing Status:   Future    Expected Date:   02/19/2024    Expiration Date:   10/19/2024   Follow up in 4 months All questions were answered. The patient knows to call the clinic with any problems, questions or concerns.  Rickard Patience, MD, PhD [facility] Hematology Oncology 10/20/2023   HISTORY OF PRESENTING ILLNESS:   Shelly Phillips is a  89 y.o.  female with PMH listed below was seen in consultation at the request of  Marisue Ivan, MD  for evaluation of lymphocytosis  She has a history of elevated white blood cell  count, first noted in 2017, with a total white blood cell count of 14. 10/02/2023 cbc showed wbc 16.3, predominately lymphocytosis, ALC 8.7.  as noted in recent blood work. Smear showed raising suspicion for chronic lymphocytic leukemia (CLL).  No unintentional weight loss, night sweats, or low-grade fever. She experiences fatigue and occasional weakness, which she attributes to aging. She has a variable appetite and numbness in her fingers.  INTERVAL HISTORY Shelly Phillips is a 89 y.o. female who has above history reviewed by me today presents for follow up visit for leukocytosis  Patient presents to discuss results.  She has no new complaints. Accompanied by her friend [facility]  MEDICAL HISTORY:  Past Medical History:  Diagnosis Date   Basal cell carcinoma 04/14/2014   nose - Mohs Dr Adriana Simas   Basal cell carcinoma 10/19/2022   Right medial cheek, needs mohs. Patient states she did not have Mohs surgery with Dr. Adriana Simas. She states the area healed well and cleared up. She called Dr. Patsey Berthold office and cancelled appointment.   HLD (hyperlipidemia)    Hypertension    Macular degeneration    Obesity    Pre-diabetes    Rosacea    Skin cancer    Squamous cell carcinoma of skin 05/23/2018   right midline mid dorsum nose - [facility]   Squamous cell carcinoma of skin 04/26/2023   Left Supratip of Nose, [facility]   Urinary tract infection     SURGICAL HISTORY: Past Surgical History:  Procedure Laterality Date   ABDOMINAL HYSTERECTOMY     CATARACT EXTRACTION W/PHACO Right 11/29/2016   Procedure: CATARACT EXTRACTION PHACO AND INTRAOCULAR LENS PLACEMENT (IOC);  Surgeon: Galen Manila, MD;  Location: [facility] ORS;  Service: Ophthalmology;  Laterality: Right;  [place] 49.8 AP% 21.9 CDE 10.89 Fluid Pack Lot # [id number]   FACIAL RECONSTRUCTION SURGERY     FRACTURE SURGERY      SOCIAL HISTORY: Social History   Socioeconomic History   Marital status: Widowed    Spouse name: Not on file   Number of children:  Not on file   Years of education: Not on file   Highest education level: Not on file  Occupational History   Not on file  Tobacco Use   Smoking status: Former    Current packs/day: 0.00    Types: Cigarettes    Quit date: 1980    Years since quitting: 45.2   Smokeless tobacco: Never  Substance and Sexual Activity   Alcohol use: No   Drug use: No   Sexual activity: Not on file  Other Topics Concern   Not on file  Social History Narrative   Not on file   Social Drivers of Health   Financial Resource Strain: Low Risk  (10/02/2023)   Received from [facility]   Overall Financial Resource Strain (CARDIA)    Difficulty of Paying Living Expenses: Not hard at all  Food Insecurity: No Food Insecurity (10/20/2023)   Hunger Vital Sign    Worried About Running Out of Food in the Last Year: Never true    Ran Out of Food in the Last Year: Never true  Transportation Needs: No Transportation Needs (10/20/2023)   PRAPARE - Administrator, Civil Service (Medical): No    Lack of Transportation (Non-Medical): No  Physical Activity: Not on file  Stress: Not on file  Social Connections: Not on file  Intimate Partner Violence: Not At Risk (10/20/2023)   Humiliation, Afraid, Rape, and Kick questionnaire    Fear of Current or Ex-Partner: No    Emotionally Abused: No    Physically Abused: No    Sexually Abused: No    FAMILY HISTORY: Family History  Problem Relation Age of Onset   Parkinson's disease Mother    CAD Father    Cancer Brother        brain tumor    ALLERGIES:  is allergic to demerol [meperidine].  MEDICATIONS:  Current Outpatient Medications  Medication Sig Dispense Refill   acetaminophen (TYLENOL) 500 MG tablet Take 1,000 mg by mouth at bedtime.     aspirin EC 81 MG tablet Take 1 tablet by mouth daily.     atorvastatin (LIPITOR) 20 MG tablet Take 20 mg by mouth daily.     metoprolol tartrate (LOPRESSOR) 25 MG tablet Take 1 tablet (25 mg total) by  mouth 2 (two) times daily. 60 tablet 0   Multiple Vitamin (MULTI-VITAMIN) tablet Take 1 tablet by mouth daily.     Multiple Vitamins-Minerals (PRESERVISION AREDS PO) Take by mouth.     sodium chloride (OCEAN) 0.65 % SOLN nasal spray Place 2 sprays into both nostrils 2 (two) times daily.     triamcinolone (NASACORT ALLERGY 24HR) 55 MCG/ACT AERO nasal inhaler Place 1 spray into the nose 2 (two) times daily.     No current facility-administered medications for this visit.    Review of Systems  Constitutional:  Positive for fatigue. Negative for appetite change, chills, fever and unexpected weight  change.  HENT:   Negative for hearing loss and voice change.   Eyes:  Negative for eye problems.  Respiratory:  Negative for chest tightness and cough.   Cardiovascular:  Negative for chest pain.  Gastrointestinal:  Negative for abdominal distention, abdominal pain and blood in stool.  Endocrine: Negative for hot flashes.  Genitourinary:  Negative for difficulty urinating and frequency.   Musculoskeletal:  Negative for arthralgias.  Skin:  Negative for itching and rash.  Neurological:  Positive for numbness. Negative for extremity weakness.  Hematological:  Negative for adenopathy.  Psychiatric/Behavioral:  Negative for confusion.    PHYSICAL EXAMINATION: ECOG PERFORMANCE STATUS: 1 - Symptomatic but completely ambulatory Vitals:   10/20/23 1220  BP: (!) 147/78  Pulse: 71  Resp: 18  Temp: (!) 96.9 F (36.1 C)  SpO2: 96%   Filed Weights   10/20/23 1220  Weight: 229 lb 14.4 oz (104.3 kg)    Physical Exam Constitutional:      General: She is not in acute distress. HENT:     Head: Normocephalic and atraumatic.  Eyes:     General: No scleral icterus. Cardiovascular:     Rate and Rhythm: Normal rate.  Pulmonary:     Effort: Pulmonary effort is normal. No respiratory distress.  Abdominal:     General: There is no distension.  Musculoskeletal:        General: Normal range of motion.      Cervical back: Normal range of motion and neck supple.  Skin:    Findings: No rash.  Neurological:     Mental Status: She is alert and oriented to person, place, and time. Mental status is at baseline.  Psychiatric:        Mood and Affect: Mood normal.     LABORATORY DATA:  I have reviewed the data as listed    Latest Ref Rng & Units 10/06/2023   11:56 AM 10/02/2023    3:10 PM 11/06/2016    4:35 PM  CBC  WBC 4.0 - 10.5 K/uL 15.7  16.3  18.3   Hemoglobin 12.0 - 15.0 g/dL 73.2  20.2  54.2   Hematocrit 36.0 - 46.0 % 43.9  45.3  36.3   Platelets 150 - 400 K/uL 174  177  168       Latest Ref Rng & Units 11/06/2016    4:35 PM 03/17/2016    4:07 AM 03/16/2016    4:41 AM  CMP  Glucose 65 - 99 mg/dL 706  237  628   BUN 6 - 20 mg/dL 13  31  58   Creatinine 0.44 - 1.00 mg/dL 3.15  1.76  1.60   Sodium 135 - 145 mmol/L 133  139  136   Potassium 3.5 - 5.1 mmol/L 2.9  3.9  3.2   Chloride 101 - 111 mmol/L 101  109  104   CO2 22 - 32 mmol/L 22  23  24    Calcium 8.9 - 10.3 mg/dL 8.4  8.4  8.5   Total Protein 6.5 - 8.1 g/dL 6.9  6.5    Total Bilirubin 0.3 - 1.2 mg/dL 1.3  0.5    Alkaline Phos 38 - 126 U/L 89  178    AST 15 - 41 U/L 15  126    ALT 14 - 54 U/L 13  127        RADIOGRAPHIC STUDIES: I have personally reviewed the radiological images as listed and agreed with the findings in the report. No  results found.

## 2023-11-22 ENCOUNTER — Ambulatory Visit: Payer: Medicare Other | Admitting: Dermatology

## 2023-12-18 ENCOUNTER — Encounter: Payer: Self-pay | Admitting: Dermatology

## 2023-12-18 ENCOUNTER — Ambulatory Visit (INDEPENDENT_AMBULATORY_CARE_PROVIDER_SITE_OTHER): Admitting: Dermatology

## 2023-12-18 DIAGNOSIS — L578 Other skin changes due to chronic exposure to nonionizing radiation: Secondary | ICD-10-CM | POA: Diagnosis not present

## 2023-12-18 DIAGNOSIS — W908XXA Exposure to other nonionizing radiation, initial encounter: Secondary | ICD-10-CM | POA: Diagnosis not present

## 2023-12-18 DIAGNOSIS — L820 Inflamed seborrheic keratosis: Secondary | ICD-10-CM

## 2023-12-18 DIAGNOSIS — L570 Actinic keratosis: Secondary | ICD-10-CM

## 2023-12-18 DIAGNOSIS — Z8589 Personal history of malignant neoplasm of other organs and systems: Secondary | ICD-10-CM

## 2023-12-18 DIAGNOSIS — D692 Other nonthrombocytopenic purpura: Secondary | ICD-10-CM

## 2023-12-18 DIAGNOSIS — L821 Other seborrheic keratosis: Secondary | ICD-10-CM | POA: Diagnosis not present

## 2023-12-18 DIAGNOSIS — D1801 Hemangioma of skin and subcutaneous tissue: Secondary | ICD-10-CM | POA: Diagnosis not present

## 2023-12-18 DIAGNOSIS — Z85828 Personal history of other malignant neoplasm of skin: Secondary | ICD-10-CM

## 2023-12-18 NOTE — Patient Instructions (Addendum)
Actinic keratoses are precancerous spots that appear secondary to cumulative UV radiation exposure/sun exposure over time. They are chronic with expected duration over 1 year. A portion of actinic keratoses will progress to squamous cell carcinoma of the skin. It is not possible to reliably predict which spots will progress to skin cancer and so treatment is recommended to prevent development of skin cancer.  Recommend daily broad spectrum sunscreen SPF 30+ to sun-exposed areas, reapply every 2 hours as needed.  Recommend staying in the shade or wearing long sleeves, sun glasses (UVA+UVB protection) and wide brim hats (4-inch brim around the entire circumference of the hat). Call for new or changing lesions.     Cryotherapy Aftercare  Wash gently with soap and water everyday.   Apply Vaseline and Band-Aid daily until healed.     Seborrheic Keratosis  What causes seborrheic keratoses? Seborrheic keratoses are harmless, common skin growths that first appear during adult life.  As time goes by, more growths appear.  Some people may develop a large number of them.  Seborrheic keratoses appear on both covered and uncovered body parts.  They are not caused by sunlight.  The tendency to develop seborrheic keratoses can be inherited.  They vary in color from skin-colored to gray, brown, or even black.  They can be either smooth or have a rough, warty surface.   Seborrheic keratoses are superficial and look as if they were stuck on the skin.  Under the microscope this type of keratosis looks like layers upon layers of skin.  That is why at times the top layer may seem to fall off, but the rest of the growth remains and re-grows.    Treatment Seborrheic keratoses do not need to be treated, but can easily be removed in the office.  Seborrheic keratoses often cause symptoms when they rub on clothing or jewelry.  Lesions can be in the way of shaving.  If they become inflamed, they can cause itching,  soreness, or burning.  Removal of a seborrheic keratosis can be accomplished by freezing, burning, or surgery. If any spot bleeds, scabs, or grows rapidly, please return to have it checked, as these can be an indication of a skin cancer.    Due to recent changes in healthcare laws, you may see results of your pathology and/or laboratory studies on MyChart before the doctors have had a chance to review them. We understand that in some cases there may be results that are confusing or concerning to you. Please understand that not all results are received at the same time and often the doctors may need to interpret multiple results in order to provide you with the best plan of care or course of treatment. Therefore, we ask that you please give [place] 2 business days to thoroughly review all your results before contacting the office for clarification. Should we see a critical lab result, you will be contacted sooner.   If You Need Anything After Your Visit  If you have any questions or concerns for your doctor, please call our main line at [phone] and press option 4 to reach your doctor's medical assistant. If no one answers, please leave a voicemail as directed and we will return your call as soon as possible. Messages left after 4 pm will be answered the following business day.   You may also send [place] a message via MyChart. We typically respond to MyChart messages within 1-2 business days.  For prescription refills, please ask your pharmacy to contact  our office. Our fax number is [phone].  If you have an urgent issue when the clinic is closed that cannot wait until the next business day, you can page your doctor at the number below.    Please note that while we do our best to be available for urgent issues outside of office hours, we are not available 24/7.   If you have an urgent issue and are unable to reach [place], you may choose to seek medical care at your doctor's office, retail clinic,  urgent care center, or emergency room.  If you have a medical emergency, please immediately call 911 or go to the emergency department.  Pager Numbers  - Dr. Gwen Pounds: [phone]  - Dr. Roseanne Reno: [phone]  - Dr. Katrinka Blazing: [phone]   In the event of inclement weather, please call our main line at [phone] for an update on the status of any delays or closures.  Dermatology Medication Tips: Please keep the boxes that topical medications come in in order to help keep track of the instructions about where and how to use these. Pharmacies typically print the medication instructions only on the boxes and not directly on the medication tubes.   If your medication is too expensive, please contact our office at [phone] option 4 or send [place] a message through MyChart.   We are unable to tell what your co-pay for medications will be in advance as this is different depending on your insurance coverage. However, we may be able to find a substitute medication at lower cost or fill out paperwork to get insurance to cover a needed medication.   If a prior authorization is required to get your medication covered by your insurance company, please allow [place] 1-2 business days to complete this process.  Drug prices often vary depending on where the prescription is filled and some pharmacies may offer cheaper prices.  The website www.goodrx.com contains coupons for medications through different pharmacies. The prices here do not account for what the cost may be with help from insurance (it may be cheaper with your insurance), but the website can give you the price if you did not use any insurance.  - You can print the associated coupon and take it with your prescription to the pharmacy.  - You may also stop by our office during regular business hours and pick up a GoodRx coupon card.  - If you need your prescription sent electronically to a different pharmacy, notify our office through [facility] or by phone at [phone] option 4.     Si Usted Necesita Algo Despus de Su Visita  Tambin puede enviarnos un mensaje a travs de Clinical cytogeneticist. Por lo general respondemos a los mensajes de MyChart en el transcurso de 1 a 2 das hbiles.  Para renovar recetas, por favor pida a su farmacia que se ponga en contacto con nuestra oficina. Annie Sable de fax es [place] [phone].  Si tiene un asunto urgente cuando la clnica est cerrada y que no puede esperar hasta el siguiente da hbil, puede llamar/localizar a su doctor(a) al nmero que aparece a continuacin.   Por favor, tenga en cuenta que aunque hacemos todo lo posible para estar disponibles para asuntos urgentes fuera del horario de [place], no estamos disponibles las 24 horas del da, los 7 [address]  [address] de la [place].   Si tiene un problema urgente y no puede comunicarse con nosotros, puede optar por buscar atencin mdica  en el consultorio de su doctor(a), en una clnica  privada, en un centro de atencin urgente o en una sala de emergencias.  Si tiene Engineer, drilling, por favor llame inmediatamente al 911 o vaya a la sala de emergencias.  Nmeros de bper  - Dr. Gwen Pounds: [phone]  - Dra. Roseanne Reno: [phone]  - Dr. Katrinka Blazing: [phone]   En caso de inclemencias del tiempo, por favor llame a Lacy Duverney principal al [phone] para una actualizacin sobre el [place] de cualquier retraso o cierre.  Consejos para la medicacin en dermatologa: Por favor, guarde las cajas en las que vienen los medicamentos de uso tpico para ayudarle a seguir las instrucciones sobre dnde y cmo usarlos. Las farmacias generalmente imprimen las instrucciones del medicamento slo en las cajas y no directamente en los tubos del [place].   Si su medicamento es muy caro, por favor, pngase en contacto con Rolm Gala llamando al [phone] y presione la opcin 4 o envenos un mensaje a travs de Clinical cytogeneticist.   No podemos decirle cul  ser su copago por los medicamentos por adelantado ya que esto es diferente dependiendo de la cobertura de su seguro. Sin embargo, es posible que podamos encontrar un medicamento sustituto a Audiological scientist un formulario para que el seguro cubra el medicamento que se considera necesario.   Si se requiere una autorizacin previa para que su compaa de seguros [place] su medicamento, por favor permtanos de 1 a 2 das hbiles para completar [address]  Los precios de los medicamentos varan con frecuencia dependiendo del Environmental consultant de dnde se surte la receta y alguna farmacias pueden ofrecer precios ms baratos.  El sitio web www.goodrx.com tiene cupones para medicamentos de Health and safety inspector. Los precios aqu no tienen en cuenta lo que podra costar con la ayuda del seguro (puede ser ms barato con su seguro), pero el sitio web puede darle el precio si no utiliz Tourist information centre manager.  - Puede imprimir el cupn correspondiente y llevarlo con su receta a la farmacia.  - Tambin puede pasar por nuestra oficina durante el horario de atencin regular y Education officer, museum una tarjeta de cupones de GoodRx.  - Si necesita que su receta se enve electrnicamente a una farmacia diferente, informe a nuestra oficina a travs de MyChart de [place] o por telfono llamando al [phone] y presione la opcin 4.

## 2023-12-18 NOTE — Progress Notes (Signed)
 Follow-Up Visit   Subjective  Shelly Phillips is a 89 y.o. female who presents for the following: 6 month followup Recheck a spot at right cheek and prior bx at left nose  The patient has spots, moles and lesions to be evaluated, some may be new or changing and the patient may have concern these could be cancer.  The following portions of the chart were reviewed this encounter and updated as appropriate: medications, allergies, medical history  Review of Systems:  No other skin or systemic complaints except as noted in HPI or Assessment and Plan.  Objective  Well appearing patient in no apparent distress; mood and affect are within normal limits.  A focused examination was performed of the following areas: Nose, right medial cheek, face, back, neck, b/l arms  Relevant exam findings are noted in the Assessment and Plan.  left temple x 1, right zygoma x 1, right lateral neck x 3 (5) Erythematous stuck-on, waxy papule or plaque right cheek x 3 (3) Erythematous thin papules/macules with gritty scale.   Assessment & Plan   SEBORRHEIC KERATOSIS - back and face - Stuck-on, waxy, tan-brown papules and/or plaques  - Benign-appearing - Discussed benign etiology and prognosis. - Observe - Call for any changes  HEMANGIOMA back Exam: red papule(s) Discussed benign nature. Recommend observation. Call for changes.  Purpura - Chronic; persistent and recurrent.  Treatable, but not curable. arms - Violaceous macules and patches - Benign - Related to trauma, age, sun damage and/or use of blood thinners, chronic use of topical and/or oral steroids - Observe - Can use OTC arnica containing moisturizer such as Dermend Bruise Formula if desired - Call for worsening or other concerns  ACTINIC DAMAGE - chronic, secondary to cumulative UV radiation exposure/sun exposure over time - diffuse scaly erythematous macules with underlying dyspigmentation - Recommend daily broad spectrum  sunscreen SPF 30+ to sun-exposed areas, reapply every 2 hours as needed.  - Recommend staying in the shade or wearing long sleeves, sun glasses (UVA+UVB protection) and wide brim hats (4-inch brim around the entire circumference of the hat). - Call for new or changing lesions.  Basal Cell Carcinoma Exam: Clear today  at right medial cheek. Treatment:  Hx of pink smooth macule at right medial cheek. Benign. Observe Watch closely for recurrence. Pt cancelled Mohs surgery appt.  HISTORY OF BASAL CELL CARCINOMA OF THE SKIN - No evidence of recurrence today - Recommend regular full body skin exams - Recommend daily broad spectrum sunscreen SPF 30+ to sun-exposed areas, reapply every 2 hours as needed.  - Call if any new or changing lesions are noted between office visits  HISTORY OF SQUAMOUS CELL CARCINOMA OF THE SKIN Left supratip of nose treated with ED&C done 05/01/2023 - No evidence of recurrence today - No lymphadenopathy - Recommend regular full body skin exams - Recommend daily broad spectrum sunscreen SPF 30+ to sun-exposed areas, reapply every 2 hours as needed.  - Call if any new or changing lesions are noted between office visits  INFLAMED SEBORRHEIC KERATOSIS (5) left temple x 1, right zygoma x 1, right lateral neck x 3 (5) Symptomatic, irritating, patient would like treated. Destruction of lesion - left temple x 1, right zygoma x 1, right lateral neck x 3 (5) Complexity: simple   Destruction method: cryotherapy   Informed consent: discussed and consent obtained   Timeout:  patient name, date of birth, surgical site, and procedure verified Lesion destroyed using liquid nitrogen: Yes   Region  frozen until ice ball extended beyond lesion: Yes   Outcome: patient tolerated procedure well with no complications   Post-procedure details: wound care instructions given   ACTINIC KERATOSIS (3) right cheek x 3 (3) Actinic keratoses are precancerous spots that appear secondary to  cumulative UV radiation exposure/sun exposure over time. They are chronic with expected duration over 1 year. A portion of actinic keratoses will progress to squamous cell carcinoma of the skin. It is not possible to reliably predict which spots will progress to skin cancer and so treatment is recommended to prevent development of skin cancer.  Recommend daily broad spectrum sunscreen SPF 30+ to sun-exposed areas, reapply every 2 hours as needed.  Recommend staying in the shade or wearing long sleeves, sun glasses (UVA+UVB protection) and wide brim hats (4-inch brim around the entire circumference of the hat). Call for new or changing lesions. Destruction of lesion - right cheek x 3 (3)  Return for 6 - 8 month hx of bcc hx of scc isk and ak follow up.  IRandee Busing, CMA, am acting as scribe for Celine Collard, MD.   Documentation: I have reviewed the above documentation for accuracy and completeness, and I agree with the above.  Celine Collard, MD

## 2024-02-19 ENCOUNTER — Encounter: Payer: Self-pay | Admitting: Oncology

## 2024-02-19 ENCOUNTER — Inpatient Hospital Stay (HOSPITAL_BASED_OUTPATIENT_CLINIC_OR_DEPARTMENT_OTHER): Admitting: Oncology

## 2024-02-19 ENCOUNTER — Inpatient Hospital Stay: Attending: Oncology

## 2024-02-19 VITALS — BP 148/80 | HR 51 | Temp 97.8°F | Resp 18 | Wt 222.4 lb

## 2024-02-19 DIAGNOSIS — Z79899 Other long term (current) drug therapy: Secondary | ICD-10-CM | POA: Diagnosis not present

## 2024-02-19 DIAGNOSIS — N1832 Chronic kidney disease, stage 3b: Secondary | ICD-10-CM

## 2024-02-19 DIAGNOSIS — R300 Dysuria: Secondary | ICD-10-CM | POA: Diagnosis not present

## 2024-02-19 DIAGNOSIS — I129 Hypertensive chronic kidney disease with stage 1 through stage 4 chronic kidney disease, or unspecified chronic kidney disease: Secondary | ICD-10-CM | POA: Insufficient documentation

## 2024-02-19 DIAGNOSIS — N183 Chronic kidney disease, stage 3 unspecified: Secondary | ICD-10-CM | POA: Diagnosis not present

## 2024-02-19 DIAGNOSIS — Z87891 Personal history of nicotine dependence: Secondary | ICD-10-CM | POA: Insufficient documentation

## 2024-02-19 DIAGNOSIS — Z7982 Long term (current) use of aspirin: Secondary | ICD-10-CM | POA: Insufficient documentation

## 2024-02-19 DIAGNOSIS — D472 Monoclonal gammopathy: Secondary | ICD-10-CM | POA: Diagnosis not present

## 2024-02-19 DIAGNOSIS — C9110 Chronic lymphocytic leukemia of B-cell type not having achieved remission: Secondary | ICD-10-CM | POA: Insufficient documentation

## 2024-02-19 DIAGNOSIS — D72820 Lymphocytosis (symptomatic): Secondary | ICD-10-CM

## 2024-02-19 LAB — CBC WITH DIFFERENTIAL (CANCER CENTER ONLY)
Abs Immature Granulocytes: 0.06 K/uL (ref 0.00–0.07)
Basophils Absolute: 0.1 K/uL (ref 0.0–0.1)
Basophils Relative: 1 %
Eosinophils Absolute: 0.1 K/uL (ref 0.0–0.5)
Eosinophils Relative: 1 %
HCT: 42.4 % (ref 36.0–46.0)
Hemoglobin: 13.9 g/dL (ref 12.0–15.0)
Immature Granulocytes: 0 %
Lymphocytes Relative: 47 %
Lymphs Abs: 7 K/uL — ABNORMAL HIGH (ref 0.7–4.0)
MCH: 30.1 pg (ref 26.0–34.0)
MCHC: 32.8 g/dL (ref 30.0–36.0)
MCV: 91.8 fL (ref 80.0–100.0)
Monocytes Absolute: 0.7 K/uL (ref 0.1–1.0)
Monocytes Relative: 5 %
Neutro Abs: 6.7 K/uL (ref 1.7–7.7)
Neutrophils Relative %: 46 %
Platelet Count: 142 K/uL — ABNORMAL LOW (ref 150–400)
RBC: 4.62 MIL/uL (ref 3.87–5.11)
RDW: 15.3 % (ref 11.5–15.5)
WBC Count: 14.6 K/uL — ABNORMAL HIGH (ref 4.0–10.5)
nRBC: 0 % (ref 0.0–0.2)

## 2024-02-19 LAB — CMP (CANCER CENTER ONLY)
ALT: 14 U/L (ref 0–44)
AST: 16 U/L (ref 15–41)
Albumin: 3.6 g/dL (ref 3.5–5.0)
Alkaline Phosphatase: 99 U/L (ref 38–126)
Anion gap: 7 (ref 5–15)
BUN: 21 mg/dL (ref 8–23)
CO2: 21 mmol/L — ABNORMAL LOW (ref 22–32)
Calcium: 9.2 mg/dL (ref 8.9–10.3)
Chloride: 108 mmol/L (ref 98–111)
Creatinine: 1.11 mg/dL — ABNORMAL HIGH (ref 0.44–1.00)
GFR, Estimated: 48 mL/min — ABNORMAL LOW (ref >60)
Glucose, Bld: 116 mg/dL — ABNORMAL HIGH (ref 70–99)
Potassium: 4.3 mmol/L (ref 3.5–5.1)
Sodium: 136 mmol/L (ref 135–145)
Total Bilirubin: 0.9 mg/dL (ref 0.0–1.2)
Total Protein: 7.2 g/dL (ref 6.5–8.1)

## 2024-02-19 LAB — LACTATE DEHYDROGENASE: LDH: 133 U/L (ref 98–192)

## 2024-02-19 NOTE — Progress Notes (Signed)
 Hematology/Oncology Progress note [phone] [phone]           REFERRING PROVIDER: Alla Amis, MD   CHIEF COMPLAINTS/REASON FOR VISIT:  CLL. 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 Peripheral blood flow cytometry showed Chronic lymphocytic leukemia, B-cell, CD38 positive (82%), CD20+, CD22+  Currently patient has no constitutional symptoms.  No cytopenia Check CLL [facility] and IGHV I recommend watchful waiting.  CKD (chronic kidney disease) stage 3, GFR 30-59 ml/min (HCC) Encourage oral hydration and avoid nephrotoxins.    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     Dysuria Patient has been treated for UTI, managed by primary care provider. Recent urine culture was negative. Now she has dysuria again.  I recommend patient to further discuss with PCP. I will defer to PCP for management. She has normal immunoglobulin level.  CLL without any cause persistent symptoms. Discussed about urology evaluation for recurrent UTI.  She declined.    Orders Placed This Encounter  Procedures   CMP (Cancer Center only)    Standing Status:   Future    Expected Date:   06/20/2024    Expiration Date:   09/18/2024   CBC with Differential (Cancer Center Only)    Standing Status:   Future    Expected Date:   06/20/2024    Expiration Date:   09/18/2024   Lactate dehydrogenase    Standing Status:   Future    Expected Date:   06/20/2024    Expiration Date:   09/18/2024   Multiple Myeloma Panel (SPEP&IFE w/QIG)    Standing Status:   Future    Expected Date:   06/20/2024    Expiration Date:   09/18/2024   Kappa/lambda light chains    Standing Status:    Future    Expected Date:   06/20/2024    Expiration Date:   09/18/2024   Follow up in 4 months All questions were answered. The patient knows to call the clinic with any problems, questions or concerns.  Zelphia Cap, MD, PhD [facility] Oncology 02/19/2024   HISTORY OF PRESENTING ILLNESS:   Shelly Phillips is a  89 y.o.  female with PMH listed below was seen in consultation at the request of  Alla Amis, MD  for evaluation of lymphocytosis  She has a history of elevated white blood cell count, first noted in 2017, with a total white blood cell count of 14. 10/02/2023 cbc showed wbc 16.3, predominately lymphocytosis, ALC 8.7.  as noted in recent blood work. Smear showed raising suspicion for chronic lymphocytic leukemia (CLL).  No unintentional weight loss, night sweats, or low-grade fever. She experiences fatigue and occasional weakness, which she attributes to aging. She has a variable appetite and numbness in her fingers.  INTERVAL HISTORY Shelly Phillips is a 89 y.o. female who has above history reviewed by me today presents for follow up visit for CLL, MUGS Patient denies unintentional weight loss, night sweats or fever.  She was treated for UTI with a course of antibiotics recently.  Most recent repeat urine culture was negative.  Patient reports having recurrent dysuria again.  She felt that she was  not adequately treated last time.  No fever or flank pain.  MEDICAL HISTORY:  Past Medical History:  Diagnosis Date   Basal cell carcinoma 04/14/2014   nose - Mohs Dr Bluford   Basal cell carcinoma 10/19/2022   Right medial cheek, needs mohs. Patient states she did not have Mohs surgery with Dr. Bluford. She states the area healed well and cleared up. She called Dr. Metta office and cancelled appointment.   HLD (hyperlipidemia)    Hypertension    Macular degeneration    Obesity    Pre-diabetes    Rosacea    Skin cancer    Squamous cell carcinoma of skin 05/23/2018    right midline mid dorsum nose - [facility]   Squamous cell carcinoma of skin 04/26/2023   Left Supratip of Nose, [facility]   Urinary tract infection     SURGICAL HISTORY: Past Surgical History:  Procedure Laterality Date   ABDOMINAL HYSTERECTOMY     CATARACT EXTRACTION W/PHACO Right 11/29/2016   Procedure: CATARACT EXTRACTION PHACO AND INTRAOCULAR LENS PLACEMENT (IOC);  Surgeon: Jaye Fallow, MD;  Location: [facility] ORS;  Service: Ophthalmology;  Laterality: Right;  US  49.8 AP% 21.9 CDE 10.89 Fluid Pack Lot # [id number] H   FACIAL RECONSTRUCTION SURGERY     FRACTURE SURGERY      SOCIAL HISTORY: Social History   Socioeconomic History   Marital status: Widowed    Spouse name: Not on file   Number of children: Not on file   Years of education: Not on file   Highest education level: Not on file  Occupational History   Not on file  Tobacco Use   Smoking status: Former    Current packs/day: 0.00    Types: Cigarettes    Quit date: 1980    Years since quitting: 45.6   Smokeless tobacco: Never  Substance and Sexual Activity   Alcohol use: No   Drug use: No   Sexual activity: Not on file  Other Topics Concern   Not on file  Social History Narrative   Not on file   Social Drivers of Health   Financial Resource Strain: Low Risk  (10/02/2023)   Received from [facility]   Overall Financial Resource Strain (CARDIA)    Difficulty of Paying Living Expenses: Not hard at all  Food Insecurity: No Food Insecurity (10/20/2023)   Hunger Vital Sign    Worried About Running Out of Food in the Last Year: Never true    Ran Out of Food in the Last Year: Never true  Transportation Needs: No Transportation Needs (10/20/2023)   PRAPARE - Administrator, Civil Service (Medical): No    Lack of Transportation (Non-Medical): No  Physical Activity: Not on file  Stress: Not on file  Social Connections: Not on file  Intimate Partner Violence: Not At Risk (10/20/2023)   Humiliation,  Afraid, Rape, and Kick questionnaire    Fear of Current or Ex-Partner: No    Emotionally Abused: No    Physically Abused: No    Sexually Abused: No    FAMILY HISTORY: Family History  Problem Relation Age of Onset   Parkinson's disease Mother    CAD Father    Cancer Brother        brain tumor    ALLERGIES:  is allergic to demerol [meperidine].  MEDICATIONS:  Current Outpatient Medications  Medication Sig Dispense Refill   acetaminophen  (TYLENOL ) 500 MG tablet Take 1,000 mg by mouth at bedtime.  aspirin  EC 81 MG tablet Take 1 tablet by mouth daily.     atorvastatin  (LIPITOR) 20 MG tablet Take 20 mg by mouth daily.     metoprolol  tartrate (LOPRESSOR ) 25 MG tablet Take 1 tablet (25 mg total) by mouth 2 (two) times daily. 60 tablet 0   Multiple Vitamin (MULTI-VITAMIN) tablet Take 1 tablet by mouth daily.     Multiple Vitamins-Minerals (PRESERVISION AREDS PO) Take by mouth.     sodium chloride  (OCEAN) 0.65 % SOLN nasal spray Place 2 sprays into both nostrils 2 (two) times daily.     triamcinolone  (NASACORT  ALLERGY 24HR) 55 MCG/ACT AERO nasal inhaler Place 1 spray into the nose 2 (two) times daily.     No current facility-administered medications for this visit.    Review of Systems  Constitutional:  Positive for fatigue. Negative for appetite change, chills, fever and unexpected weight change.  HENT:   Negative for hearing loss and voice change.   Eyes:  Negative for eye problems.  Respiratory:  Negative for chest tightness and cough.   Cardiovascular:  Negative for chest pain.  Gastrointestinal:  Negative for abdominal distention, abdominal pain and blood in stool.  Endocrine: Negative for hot flashes.  Genitourinary:  Positive for dysuria. Negative for difficulty urinating and frequency.   Musculoskeletal:  Negative for arthralgias.  Skin:  Negative for itching and rash.  Neurological:  Positive for numbness. Negative for extremity weakness.  Hematological:  Negative for  adenopathy.  Psychiatric/Behavioral:  Negative for confusion.   All other systems reviewed and are negative.  PHYSICAL EXAMINATION: ECOG PERFORMANCE STATUS: 1 - Symptomatic but completely ambulatory Vitals:   02/19/24 1059 02/19/24 1107  BP: (!) 152/76 (!) 148/80  Pulse: (!) 41 (!) 51  Resp: 18   Temp: 97.8 F (36.6 C)    Filed Weights   02/19/24 1059  Weight: 222 lb 6.4 oz (100.9 kg)    Physical Exam Constitutional:      General: She is not in acute distress. HENT:     Head: Normocephalic and atraumatic.  Eyes:     General: No scleral icterus. Cardiovascular:     Rate and Rhythm: Normal rate.  Pulmonary:     Effort: Pulmonary effort is normal. No respiratory distress.  Abdominal:     General: There is no distension.  Musculoskeletal:        General: Normal range of motion.     Cervical back: Normal range of motion and neck supple.  Skin:    Findings: No rash.  Neurological:     Mental Status: She is alert and oriented to person, place, and time. Mental status is at baseline.  Psychiatric:        Mood and Affect: Mood normal.     LABORATORY DATA:  I have reviewed the data as listed    Latest Ref Rng & Units 02/19/2024   10:39 AM 10/06/2023   11:56 AM 10/02/2023    3:10 PM  CBC  WBC 4.0 - 10.5 K/uL 14.6  15.7  16.3   Hemoglobin 12.0 - 15.0 g/dL 86.0  85.7  85.0   Hematocrit 36.0 - 46.0 % 42.4  43.9  45.3   Platelets 150 - 400 K/uL 142  174  177       Latest Ref Rng & Units 02/19/2024   10:39 AM 11/06/2016    4:35 PM 03/17/2016    4:07 AM  CMP  Glucose 70 - 99 mg/dL 883  851  842   BUN  8 - 23 mg/dL 21  13  31    Creatinine 0.44 - 1.00 mg/dL 8.88  8.74  8.87   Sodium 135 - 145 mmol/L 136  133  139   Potassium 3.5 - 5.1 mmol/L 4.3  2.9  3.9   Chloride 98 - 111 mmol/L 108  101  109   CO2 22 - 32 mmol/L 21  22  23    Calcium  8.9 - 10.3 mg/dL 9.2  8.4  8.4   Total Protein 6.5 - 8.1 g/dL 7.2  6.9  6.5   Total Bilirubin 0.0 - 1.2 mg/dL 0.9  1.3  0.5   Alkaline  Phos 38 - 126 U/L 99  89  178   AST 15 - 41 U/L 16  15  126   ALT 0 - 44 U/L 14  13  127       RADIOGRAPHIC STUDIES: I have personally reviewed the radiological images as listed and agreed with the findings in the report. No results found.

## 2024-02-19 NOTE — Progress Notes (Signed)
 Pt here for follow up. Reports that she has recurrent UTI's- being managed by PCP but would like to know if Dr. Babara has other recommendations. She has tried

## 2024-02-19 NOTE — Assessment & Plan Note (Signed)
 Patient has been treated for UTI, managed by primary care provider. Recent urine culture was negative. Now she has dysuria again.  I recommend patient to further discuss with PCP. I will defer to PCP for management. She has normal immunoglobulin level.  CLL without any cause persistent symptoms. Discussed about urology evaluation for recurrent UTI.  She declined.

## 2024-02-19 NOTE — Assessment & Plan Note (Addendum)
 Rai stage 0 CLL, diagnosis was discussed with patient. Peripheral blood flow cytometry showed Chronic lymphocytic leukemia, B-cell, CD38 positive (82%), CD20+, CD22+  Currently patient has no constitutional symptoms.  No cytopenia Check CLL [facility] and IGHV I recommend watchful waiting.

## 2024-02-19 NOTE — Assessment & Plan Note (Signed)
 IgG kappa MGUS Lab Results  Component Value Date   MPROTEIN 0.4 (H) 10/06/2023   KPAFRELGTCHN 61.6 (H) 10/06/2023   LAMBDASER 22.5 10/06/2023   KAPLAMBRATIO 2.74 (H) 10/06/2023         I recommend observation.

## 2024-02-19 NOTE — Assessment & Plan Note (Signed)
 Encourage oral hydration and avoid nephrotoxins.

## 2024-04-03 NOTE — Telephone Encounter (Signed)
 Scheduled labs and mailed appointment card

## 2024-05-06 NOTE — Assessment & Plan Note (Signed)
+  UAs, no culture data x 6 months  S/p empiric Abx w/ residual dysuria   We reviewed the core principles of recurrent UTI management in women, including relevant physiology, proper diagnostic evaluation, and the full spectrum of treatment options. I outlined the range of management strategies, noting the varying degrees of evidence supporting each. For many women, attention to lifestyle and conservative behavioral measures can significantly reduce infection frequency and improve quality of life. First-line steps include maintaining adequate hydration, avoiding constipation, and practicing proper bladder hygiene. Over-the-counter supplements such as cranberry juice or extracts may be incorporated into the diet, as they have demonstrated benefit in reducing UTI frequency. Other strategies with more limited evidence--but low risk and ease of implementation--include D-mannose and probiotics.  For postmenopausal women, we discussed the use of vaginal estrogen, its role in addressing vaginal atrophy, restoring urethrogenital flora, and its evidence-based benefit in reducing recurrent UTIs. In refractory cases, options may include a trial of suppressive antibiotic therapy with low-dose daily administration. Alternative approaches include post-coital antibiotic prophylaxis or a "pill-in-the-pocket" regimen for self-treatment in select uncomplicated cases.  - Need to establish culture data-future UTI workup must include urine culture prior to initiation of antibiotics - Suspect component of OAB, possible UTI, possible genitourinary syndrome menopause -Recommend culture data and conservative therapies first, hydration, avoidance of constipation, cranberry supplementation, lactobacillus -If continued culture proven UTIs-would move first to topical vaginal estrogen, second line consider short term 69-month suppressive daily antibiotic therapy

## 2024-05-06 NOTE — Progress Notes (Unsigned)
 05/10/24 11:22 AM   Shelly Phillips [birth year] [id number]   HPI: 89 y.o. female here for initial evaluation of recurrent UTIs  UTI Frequency: 3-4 / year (only this year, very rare UTIs prior) Constipation: No Post-coital relationship: No Vaginal atrophy / menopausal symptoms: Yes Hx of voiding dysfunction: urge urinary incontinence, stress urinary incontinence, and overactive bladder  Culture data: UA (04/03/24) - +LE, neg nitrite, 102 WBC, 26 RBC   - no culture UA (02/19/24) - +LE, neg nitrite, >182 WBC, 10 RBC   - no culture UA (02/05/24) - +LE, >182 WBC, 8 RBC   - no culture UA (01/24/24) - +LE, >182 WBC, 21 RBC, 5-50 bacteria   - no culture   Prior therapies: cranberry supplement and none  GU history:  CKD stage III, GFR 48    PMH: Past Medical History:  Diagnosis Date   Basal cell carcinoma 04/14/2014   nose - Mohs Dr Bluford   Basal cell carcinoma 10/19/2022   Right medial cheek, needs mohs. Patient states she did not have Mohs surgery with Dr. Bluford. She states the area healed well and cleared up. She called Dr. Metta office and cancelled appointment.   HLD (hyperlipidemia)    Hypertension    Macular degeneration    Obesity    Pre-diabetes    Rosacea    Skin cancer    Squamous cell carcinoma of skin 05/23/2018   right midline mid dorsum nose - [facility]   Squamous cell carcinoma of skin 04/26/2023   Left Supratip of Nose, [facility]   Urinary tract infection     Surgical History: Past Surgical History:  Procedure Laterality Date   ABDOMINAL HYSTERECTOMY     CATARACT EXTRACTION W/PHACO Right 11/29/2016   Procedure: CATARACT EXTRACTION PHACO AND INTRAOCULAR LENS PLACEMENT (IOC);  Surgeon: Jaye Fallow, MD;  Location: [facility] ORS;  Service: Ophthalmology;  Laterality: Right;  US  49.8 AP% 21.9 CDE 10.89 Fluid Pack Lot # [id number] H   FACIAL RECONSTRUCTION SURGERY     FRACTURE SURGERY      Family History: Family History  Problem Relation Age of Onset   Parkinson's  disease Mother    CAD Father    Cancer Brother        brain tumor    Social History:  reports that she quit smoking about 45 years ago. Her smoking use included cigarettes. She has never used smokeless tobacco. She reports that she does not drink alcohol and does not use drugs.      Physical Exam: BP (!) 184/102   Pulse 87    Constitutional:  Alert and oriented, No acute distress. Cardiovascular: No clubbing, cyanosis, or edema. Respiratory: Normal respiratory effort, no increased work of breathing. GI: Nondistended Skin: No rashes, bruises or suspicious lesions. Neurologic: Grossly intact, no focal deficits, moving all 4 extremities. Psychiatric: Normal mood and affect.  Laboratory Data: UA (04/03/24) - +LE, neg nitrite, 102 WBC, 26 RBC   - no culture UA (02/19/24) - +LE, neg nitrite, >182 WBC, 10 RBC   - no culture UA (02/05/24) - +LE, >182 WBC, 8 RBC   - no culture UA (01/24/24) - +LE, >182 WBC, 21 RBC, 5-50 bacteria   - no culture   Pertinent Imaging: N/A    Assessment & Plan:    Recurrent UTI Assessment & Plan: +UAs, no culture data x 6 months  S/p empiric Abx w/ residual dysuria   We reviewed the core principles of recurrent UTI management in women, including relevant physiology, proper  diagnostic evaluation, and the full spectrum of treatment options. I outlined the range of management strategies, noting the varying degrees of evidence supporting each. For many women, attention to lifestyle and conservative behavioral measures can significantly reduce infection frequency and improve quality of life. First-line steps include maintaining adequate hydration, avoiding constipation, and practicing proper bladder hygiene. Over-the-counter supplements such as cranberry juice or extracts may be incorporated into the diet, as they have demonstrated benefit in reducing UTI frequency. Other strategies with more limited evidence--but low risk and ease of implementation--include  D-mannose and probiotics.  For postmenopausal women, we discussed the use of vaginal estrogen, its role in addressing vaginal atrophy, restoring urethrogenital flora, and its evidence-based benefit in reducing recurrent UTIs. In refractory cases, options may include a trial of suppressive antibiotic therapy with low-dose daily administration. Alternative approaches include post-coital antibiotic prophylaxis or a "pill-in-the-pocket" regimen for self-treatment in select uncomplicated cases.  - Need to establish culture data-future UTI workup must include urine culture prior to initiation of antibiotics - Suspect component of OAB, possible UTI, possible genitourinary syndrome menopause -Recommend culture data and conservative therapies first, hydration, avoidance of constipation, cranberry supplementation, lactobacillus -If continued culture proven UTIs-would move first to topical vaginal estrogen, second line consider short term 55-month suppressive daily antibiotic therapy  Orders: -     Urinalysis, Complete      Penne Skye, MD 05/10/2024  [facility] [address], [address] [place], [place] [address] [phone]

## 2024-05-10 ENCOUNTER — Ambulatory Visit (INDEPENDENT_AMBULATORY_CARE_PROVIDER_SITE_OTHER): Admitting: Urology

## 2024-05-10 VITALS — BP 184/102 | HR 87

## 2024-05-10 DIAGNOSIS — N390 Urinary tract infection, site not specified: Secondary | ICD-10-CM

## 2024-05-10 LAB — URINALYSIS, COMPLETE
Bilirubin, UA: NEGATIVE
Glucose, UA: NEGATIVE
Ketones, UA: NEGATIVE
Nitrite, UA: NEGATIVE
Protein,UA: NEGATIVE
Specific Gravity, UA: 1.01 (ref 1.005–1.030)
Urobilinogen, Ur: 0.2 mg/dL (ref 0.2–1.0)
pH, UA: 6 (ref 5.0–7.5)

## 2024-05-10 LAB — MICROSCOPIC EXAMINATION: WBC, UA: >30 /HPF — AB (ref 0–5)

## 2024-05-10 NOTE — Addendum Note (Signed)
 Addended by: ELOUISE SANTA BROCKS on: 05/10/2024 11:35 AM   Modules accepted: Orders

## 2024-05-13 LAB — CULTURE, URINE COMPREHENSIVE

## 2024-05-14 ENCOUNTER — Ambulatory Visit: Payer: Self-pay | Admitting: Urology

## 2024-05-14 MED ORDER — SULFAMETHOXAZOLE-TRIMETHOPRIM 800-160 MG PO TABS: 1.0000 | ORAL_TABLET | Freq: Two times a day (BID) | ORAL | 0 refills | Status: DC

## 2024-05-14 NOTE — Progress Notes (Signed)
 Sending in Bactrim DS x 7d for recent UTI. Can you please let Ms. Muchmore know?

## 2024-05-16 ENCOUNTER — Telehealth: Payer: Self-pay | Admitting: Urology

## 2024-05-16 NOTE — Telephone Encounter (Signed)
 Rollene Ernst called stating that she has picked up Rx for Bactrim that was prescribed to patient. She is requesting a call back to review culture results. Please advise.

## 2024-05-20 ENCOUNTER — Telehealth: Payer: Self-pay

## 2024-05-20 NOTE — Telephone Encounter (Signed)
 Pts caregiver called and we were able to go over culture results and she states that patient is already starting to feel much better and seems to not be having any urinary symptoms at the moment and she will keep her follow up in a couple weeks.

## 2024-05-20 NOTE — Telephone Encounter (Signed)
 Tried to reach patient by phone call no answer. I left a detailed voicemail that we were trying to inform her of her urine culture results because she had called asking about further details involving her urine culture results. I also wanted to make sure that she had picked up her antibiotic(Bactrim) and to let us  know once she finished her dosage if she noticed a change and if her symptoms have resolved. Directed patient to call the office back at her convince. Aubrey Nettye Flegal,CMA

## 2024-05-27 ENCOUNTER — Telehealth: Payer: Self-pay

## 2024-05-27 NOTE — Telephone Encounter (Signed)
 Patient started with burning with urination on 11/10 after finishing antibiotics on 11/09. Pt started having chills today with burning. Please advise.

## 2024-05-28 NOTE — Telephone Encounter (Signed)
 Appt made for her tomorrow with Sam

## 2024-05-29 ENCOUNTER — Encounter: Payer: Self-pay | Admitting: Physician Assistant

## 2024-05-29 ENCOUNTER — Ambulatory Visit (INDEPENDENT_AMBULATORY_CARE_PROVIDER_SITE_OTHER): Admitting: Physician Assistant

## 2024-05-29 VITALS — BP 175/101 | HR 81 | Ht 68.0 in | Wt 234.0 lb

## 2024-05-29 DIAGNOSIS — N390 Urinary tract infection, site not specified: Secondary | ICD-10-CM

## 2024-05-29 LAB — URINALYSIS, COMPLETE
Bilirubin, UA: NEGATIVE
Glucose, UA: NEGATIVE
Ketones, UA: NEGATIVE
Nitrite, UA: NEGATIVE
Specific Gravity, UA: 1.01 (ref 1.005–1.030)
Urobilinogen, Ur: 0.2 mg/dL (ref 0.2–1.0)
pH, UA: 6 (ref 5.0–7.5)

## 2024-05-29 LAB — MICROSCOPIC EXAMINATION
RBC, Urine: >30 /HPF — AB (ref 0–2)
WBC, UA: >30 /HPF — AB (ref 0–5)

## 2024-05-29 MED ORDER — SULFAMETHOXAZOLE-TRIMETHOPRIM 800-160 MG PO TABS: 1.0000 | ORAL_TABLET | Freq: Two times a day (BID) | ORAL | 0 refills | Status: AC

## 2024-05-29 NOTE — Patient Instructions (Signed)
 Ultrasound scheduling: [phone]

## 2024-05-29 NOTE — Progress Notes (Signed)
 05/29/2024 2:47 PM   Shelly Phillips May 22, 1933 [id number]  CC: Chief Complaint  Patient presents with   Recurrent UTI   HPI: Shelly Phillips is a 89 y.o. female with PMH recurrent UTI who presents today for evaluation of recurrent versus persistent UTI.  She is accompanied today by her friend, Rollene, who contributes to HPI.  She completed Bactrim DS twice daily x 7 days per Dr. Georganne 3 days ago.  She developed recurrent dysuria 2 days ago.  She reports very frequent UTIs for the past 3 to 4 months.  Of the many courses of antibiotics she has had, Bactrim is the first that actually resolved her symptoms and did so very quickly.  In-office UA today positive for 1+ protein, 3+ blood, and 3+ leukocytes; urine microscopy with >30 WBCs/HPF, >30 RBCs/HPF, and many bacteria.   PMH: Past Medical History:  Diagnosis Date   Basal cell carcinoma 04/14/2014   nose - Mohs Dr Bluford   Basal cell carcinoma 10/19/2022   Right medial cheek, needs mohs. Patient states she did not have Mohs surgery with Dr. Bluford. She states the area healed well and cleared up. She called Dr. Metta office and cancelled appointment.   HLD (hyperlipidemia)    Hypertension    Macular degeneration    Obesity    Pre-diabetes    Rosacea    Skin cancer    Squamous cell carcinoma of skin 05/23/2018   right midline mid dorsum nose - [facility]   Squamous cell carcinoma of skin 04/26/2023   Left Supratip of Nose, [facility]   Urinary tract infection     Surgical History: Past Surgical History:  Procedure Laterality Date   ABDOMINAL HYSTERECTOMY     CATARACT EXTRACTION W/PHACO Right 11/29/2016   Procedure: CATARACT EXTRACTION PHACO AND INTRAOCULAR LENS PLACEMENT (IOC);  Surgeon: Jaye Fallow, MD;  Location: [facility] ORS;  Service: Ophthalmology;  Laterality: Right;  US  49.8 AP% 21.9 CDE 10.89 Fluid Pack Lot # [id number]   FACIAL RECONSTRUCTION SURGERY     FRACTURE SURGERY      Home Medications:  Allergies as of  05/29/2024       Reactions   Demerol [meperidine] Nausea And Vomiting   Diltiazem  Swelling        Medication List        Accurate as of May 29, 2024  2:47 PM. If you have any questions, ask your nurse or doctor.          acetaminophen  500 MG tablet Commonly known as: TYLENOL  Take 1,000 mg by mouth at bedtime.   aspirin  EC 81 MG tablet Take 1 tablet by mouth daily.   atorvastatin  20 MG tablet Commonly known as: LIPITOR Take 20 mg by mouth daily.   metoprolol  tartrate 25 MG tablet Commonly known as: LOPRESSOR  Take 1 tablet (25 mg total) by mouth 2 (two) times daily.   Multi-Vitamin tablet Take 1 tablet by mouth daily.   Nasacort  Allergy 24HR 55 MCG/ACT Aero nasal inhaler Generic drug: triamcinolone  Place 1 spray into the nose 2 (two) times daily.   PRESERVISION AREDS PO Take by mouth.   sodium chloride  0.65 % Soln nasal spray Commonly known as: OCEAN Place 2 sprays into both nostrils 2 (two) times daily.   sulfamethoxazole-trimethoprim 800-160 MG tablet Commonly known as: BACTRIM DS Take 1 tablet by mouth every 12 (twelve) hours.        Allergies:  Allergies  Allergen Reactions   Demerol [Meperidine] Nausea And Vomiting   Diltiazem   Swelling    Family History: Family History  Problem Relation Age of Onset   Parkinson's disease Mother    CAD Father    Cancer Brother        brain tumor    Social History:   reports that she quit smoking about 45 years ago. Her smoking use included cigarettes. She has never used smokeless tobacco. She reports that she does not drink alcohol and does not use drugs.  Physical Exam: BP (!) 175/101   Pulse 81   Ht 5' 8 (1.727 m)   Wt 234 lb (106.1 kg)   BMI 35.58 kg/m   Constitutional:  Alert and oriented, no acute distress, nontoxic appearing HEENT: [place], AT Cardiovascular: No clubbing, cyanosis, or edema Respiratory: Normal respiratory effort, no increased work of breathing Skin: No rashes, bruises or  suspicious lesions Neurologic: Grossly intact, no focal deficits, moving all 4 extremities Psychiatric: Normal mood and affect  Laboratory Data: Results for orders placed or performed in visit on 05/29/24  Microscopic Examination   Collection Time: 05/29/24  2:13 PM   Urine  Result Value Ref Range   WBC, UA >30 (A) 0 - 5 /hpf   RBC, Urine >30 (A) 0 - 2 /hpf   Epithelial Cells (non renal) 0-10 0 - 10 /hpf   Mucus, UA Present (A) Not Estab.   Bacteria, UA Many (A) None seen/Few  Urinalysis, Complete   Collection Time: 05/29/24  2:13 PM  Result Value Ref Range   Specific Gravity, UA 1.010 1.005 - 1.030   pH, UA 6.0 5.0 - 7.5   Color, UA Yellow Yellow   Appearance Ur Slightly cloudy Clear   Leukocytes,UA 3+ (A) Negative   Protein,UA 1+ (A) Negative/Trace   Glucose, UA Negative Negative   Ketones, UA Negative Negative   RBC, UA 3+ (A) Negative   Bilirubin, UA Negative Negative   Urobilinogen, Ur 0.2 0.2 - 1.0 mg/dL   Nitrite, UA Negative Negative   Microscopic Examination See below:    Assessment & Plan:   1. Recurrent UTI (Primary) UA again appears grossly positive, will resume empiric Bactrim, this time for 14 days, and repeat culture.  I am also getting a renal ultrasound to rule out underlying stone nidus.  She has significant micro heme today, will recheck urine in 4 weeks after completion of 2 weeks of culture appropriate antibiotics.  Will consider hematuria workup if she has persistent hematuria. - Urinalysis, Complete - CULTURE, URINE COMPREHENSIVE - US  RENAL; Future - sulfamethoxazole-trimethoprim (BACTRIM DS) 800-160 MG tablet; Take 1 tablet by mouth every 12 (twelve) hours.  Dispense: 28 tablet; Refill: 0   Return in about 4 weeks (around 06/26/2024) for Lab visit for UA.  Lucie Hones, PA-C  [facility] [employer] [address] [place], [place] [address] [phone]

## 2024-06-04 LAB — CULTURE, URINE COMPREHENSIVE

## 2024-06-05 ENCOUNTER — Ambulatory Visit
Admission: RE | Admit: 2024-06-05 | Discharge: 2024-06-05 | Disposition: A | Discharge status: Home / Self Care | Source: Ambulatory Visit | Attending: Physician Assistant | Admitting: Physician Assistant

## 2024-06-05 ENCOUNTER — Ambulatory Visit: Admitting: Physician Assistant

## 2024-06-05 DIAGNOSIS — N390 Urinary tract infection, site not specified: Secondary | ICD-10-CM | POA: Insufficient documentation

## 2024-06-06 ENCOUNTER — Other Ambulatory Visit

## 2024-06-06 ENCOUNTER — Ambulatory Visit: Admitting: Physician Assistant

## 2024-06-07 ENCOUNTER — Other Ambulatory Visit: Payer: Self-pay

## 2024-06-07 ENCOUNTER — Ambulatory Visit: Payer: Self-pay | Admitting: Physician Assistant

## 2024-06-07 DIAGNOSIS — N390 Urinary tract infection, site not specified: Secondary | ICD-10-CM

## 2024-06-07 MED ORDER — AMOXICILLIN-POT CLAVULANATE 875-125 MG PO TABS: 1.0000 | ORAL_TABLET | Freq: Two times a day (BID) | ORAL | 0 refills | Status: AC

## 2024-06-24 ENCOUNTER — Inpatient Hospital Stay

## 2024-06-24 ENCOUNTER — Telehealth: Payer: Self-pay | Admitting: Oncology

## 2024-06-24 NOTE — Telephone Encounter (Signed)
 Due to the weather pt needs to r/s her lab only from today to Wednesday. New date and time confirmed

## 2024-06-25 ENCOUNTER — Other Ambulatory Visit: Payer: Self-pay

## 2024-06-25 DIAGNOSIS — N390 Urinary tract infection, site not specified: Secondary | ICD-10-CM

## 2024-06-26 ENCOUNTER — Inpatient Hospital Stay: Attending: Oncology

## 2024-06-26 ENCOUNTER — Ambulatory Visit: Payer: Self-pay | Admitting: Physician Assistant

## 2024-06-26 ENCOUNTER — Other Ambulatory Visit

## 2024-06-26 DIAGNOSIS — N183 Chronic kidney disease, stage 3 unspecified: Secondary | ICD-10-CM | POA: Diagnosis not present

## 2024-06-26 DIAGNOSIS — Z79899 Other long term (current) drug therapy: Secondary | ICD-10-CM | POA: Insufficient documentation

## 2024-06-26 DIAGNOSIS — Z9841 Cataract extraction status, right eye: Secondary | ICD-10-CM | POA: Diagnosis not present

## 2024-06-26 DIAGNOSIS — Z85828 Personal history of other malignant neoplasm of skin: Secondary | ICD-10-CM | POA: Diagnosis not present

## 2024-06-26 DIAGNOSIS — R5383 Other fatigue: Secondary | ICD-10-CM | POA: Diagnosis not present

## 2024-06-26 DIAGNOSIS — Z7982 Long term (current) use of aspirin: Secondary | ICD-10-CM | POA: Diagnosis not present

## 2024-06-26 DIAGNOSIS — R531 Weakness: Secondary | ICD-10-CM | POA: Insufficient documentation

## 2024-06-26 DIAGNOSIS — N390 Urinary tract infection, site not specified: Secondary | ICD-10-CM

## 2024-06-26 DIAGNOSIS — Z8249 Family history of ischemic heart disease and other diseases of the circulatory system: Secondary | ICD-10-CM | POA: Diagnosis not present

## 2024-06-26 DIAGNOSIS — Z87891 Personal history of nicotine dependence: Secondary | ICD-10-CM | POA: Insufficient documentation

## 2024-06-26 DIAGNOSIS — Z885 Allergy status to narcotic agent status: Secondary | ICD-10-CM | POA: Diagnosis not present

## 2024-06-26 DIAGNOSIS — Z820 Family history of epilepsy and other diseases of the nervous system: Secondary | ICD-10-CM | POA: Diagnosis not present

## 2024-06-26 DIAGNOSIS — Z809 Family history of malignant neoplasm, unspecified: Secondary | ICD-10-CM | POA: Insufficient documentation

## 2024-06-26 DIAGNOSIS — C9110 Chronic lymphocytic leukemia of B-cell type not having achieved remission: Secondary | ICD-10-CM | POA: Diagnosis present

## 2024-06-26 DIAGNOSIS — Z90710 Acquired absence of both cervix and uterus: Secondary | ICD-10-CM | POA: Diagnosis not present

## 2024-06-26 DIAGNOSIS — N3289 Other specified disorders of bladder: Secondary | ICD-10-CM | POA: Diagnosis not present

## 2024-06-26 DIAGNOSIS — Z87440 Personal history of urinary (tract) infections: Secondary | ICD-10-CM | POA: Insufficient documentation

## 2024-06-26 DIAGNOSIS — R200 Anesthesia of skin: Secondary | ICD-10-CM | POA: Insufficient documentation

## 2024-06-26 DIAGNOSIS — D472 Monoclonal gammopathy: Secondary | ICD-10-CM | POA: Diagnosis not present

## 2024-06-26 LAB — CMP (CANCER CENTER ONLY)
ALT: 41 U/L (ref 0–44)
AST: 24 U/L (ref 15–41)
Albumin: 4 g/dL (ref 3.5–5.0)
Alkaline Phosphatase: 177 U/L — ABNORMAL HIGH (ref 38–126)
Anion gap: 12 (ref 5–15)
BUN: 25 mg/dL — ABNORMAL HIGH (ref 8–23)
CO2: 22 mmol/L (ref 22–32)
Calcium: 9.7 mg/dL (ref 8.9–10.3)
Chloride: 105 mmol/L (ref 98–111)
Creatinine: 1.15 mg/dL — ABNORMAL HIGH (ref 0.44–1.00)
GFR, Estimated: 45 mL/min — ABNORMAL LOW (ref >60)
Glucose, Bld: 105 mg/dL — ABNORMAL HIGH (ref 70–99)
Potassium: 4.3 mmol/L (ref 3.5–5.1)
Sodium: 138 mmol/L (ref 135–145)
Total Bilirubin: 0.6 mg/dL (ref 0.0–1.2)
Total Protein: 7.7 g/dL (ref 6.5–8.1)

## 2024-06-26 LAB — MICROSCOPIC EXAMINATION: RBC, Urine: >30 /HPF — AB (ref 0–2)

## 2024-06-26 LAB — CBC WITH DIFFERENTIAL (CANCER CENTER ONLY)
Abs Immature Granulocytes: 0.03 K/uL (ref 0.00–0.07)
Basophils Absolute: 0.1 K/uL (ref 0.0–0.1)
Basophils Relative: 1 %
Eosinophils Absolute: 0.1 K/uL (ref 0.0–0.5)
Eosinophils Relative: 1 %
HCT: 43 % (ref 36.0–46.0)
Hemoglobin: 13.6 g/dL (ref 12.0–15.0)
Immature Granulocytes: 0 %
Lymphocytes Relative: 60 %
Lymphs Abs: 7.8 K/uL — ABNORMAL HIGH (ref 0.7–4.0)
MCH: 29.2 pg (ref 26.0–34.0)
MCHC: 31.6 g/dL (ref 30.0–36.0)
MCV: 92.5 fL (ref 80.0–100.0)
Monocytes Absolute: 0.5 K/uL (ref 0.1–1.0)
Monocytes Relative: 4 %
Neutro Abs: 4.4 K/uL (ref 1.7–7.7)
Neutrophils Relative %: 34 %
Platelet Count: 149 K/uL — ABNORMAL LOW (ref 150–400)
RBC: 4.65 MIL/uL (ref 3.87–5.11)
RDW: 14.9 % (ref 11.5–15.5)
WBC Count: 12.9 K/uL — ABNORMAL HIGH (ref 4.0–10.5)
nRBC: 0 % (ref 0.0–0.2)

## 2024-06-26 LAB — URINALYSIS, COMPLETE
Bilirubin, UA: NEGATIVE
Glucose, UA: NEGATIVE
Ketones, UA: NEGATIVE
Nitrite, UA: NEGATIVE
Protein,UA: NEGATIVE
Specific Gravity, UA: 1.02 (ref 1.005–1.030)
Urobilinogen, Ur: 0.2 mg/dL (ref 0.2–1.0)
pH, UA: 6 (ref 5.0–7.5)

## 2024-06-26 LAB — LACTATE DEHYDROGENASE: LDH: 150 U/L (ref 105–235)

## 2024-06-27 LAB — KAPPA/LAMBDA LIGHT CHAINS
Kappa free light chain: 82.6 mg/L — ABNORMAL HIGH (ref 3.3–19.4)
Kappa, lambda light chain ratio: 2.44 — ABNORMAL HIGH (ref 0.26–1.65)
Lambda free light chains: 33.8 mg/L — ABNORMAL HIGH (ref 5.7–26.3)

## 2024-06-27 NOTE — Telephone Encounter (Signed)
-----   Message from [facility] sent at 06/26/2024  5:31 PM EST ----- She still has a significant amount of blood in her urine.  At this point, I recommend that we get a cystoscopy with Dr. Georganne for further evaluation.  I am deferring CT urogram in light of her renal  function. ----- Message ----- From: Interface, [facility] Lab Results In Sent: 06/26/2024   4:36 PM EST To: Samantha Vaillancourt, PA-C

## 2024-06-27 NOTE — Telephone Encounter (Signed)
 Spoke with patient and informed her per Sam, She still has a significant amount of blood in her urine. At this point, she recommends that we get a cystoscopy with Dr. Georganne for further evaluation. She is deferring CT urogram in light of her renal function. Patient verbalized understanding but decided to hold off on scheduling because she has to clear it with the one who brings her to her appointments. She will call back in to schedule appointment with Dr. Georganne.

## 2024-06-28 LAB — MULTIPLE MYELOMA PANEL, SERUM
Albumin SerPl Elph-Mcnc: 3.2 g/dL (ref 2.9–4.4)
Albumin/Glob SerPl: 0.9 (ref 0.7–1.7)
Alpha 1: 0.3 g/dL (ref 0.0–0.4)
Alpha2 Glob SerPl Elph-Mcnc: 0.8 g/dL (ref 0.4–1.0)
B-Globulin SerPl Elph-Mcnc: 1.1 g/dL (ref 0.7–1.3)
Gamma Glob SerPl Elph-Mcnc: 1.8 g/dL (ref 0.4–1.8)
Globulin, Total: 4 g/dL — ABNORMAL HIGH (ref 2.2–3.9)
IgA: 386 mg/dL (ref 64–422)
IgG (Immunoglobin G), Serum: 1662 mg/dL — ABNORMAL HIGH (ref 586–1602)
IgM (Immunoglobulin M), Srm: 145 mg/dL (ref 26–217)
M Protein SerPl Elph-Mcnc: 0.5 g/dL — ABNORMAL HIGH
Total Protein ELP: 7.2 g/dL (ref 6.0–8.5)

## 2024-07-03 ENCOUNTER — Encounter: Payer: Self-pay | Admitting: Oncology

## 2024-07-03 ENCOUNTER — Inpatient Hospital Stay: Admitting: Oncology

## 2024-07-03 VITALS — BP 168/88 | HR 66 | Temp 96.0°F | Resp 18 | Wt 224.9 lb

## 2024-07-03 DIAGNOSIS — N1832 Chronic kidney disease, stage 3b: Secondary | ICD-10-CM

## 2024-07-03 DIAGNOSIS — D472 Monoclonal gammopathy: Secondary | ICD-10-CM

## 2024-07-03 DIAGNOSIS — C9110 Chronic lymphocytic leukemia of B-cell type not having achieved remission: Secondary | ICD-10-CM | POA: Diagnosis not present

## 2024-07-03 NOTE — Assessment & Plan Note (Addendum)
 Rai stage 0 CLL, diagnosis was discussed with patient. Peripheral blood flow cytometry showed Chronic lymphocytic leukemia, B-cell, CD38 positive (82%), CD20+, CD22+  Currently patient has no constitutional symptoms.  No cytopenia Check CLL FISH and IGHV I recommend watchful waiting.

## 2024-07-03 NOTE — Assessment & Plan Note (Signed)
 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

## 2024-07-03 NOTE — Progress Notes (Signed)
 Hematology/Oncology Progress note [phone] [phone]           REFERRING PROVIDER: Alla Amis, MD   CHIEF COMPLAINTS/REASON FOR VISIT:  CLL. 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 Peripheral blood flow cytometry showed Chronic lymphocytic leukemia, B-cell, CD38 positive (82%), CD20+, CD22+  Currently patient has no constitutional symptoms.  No cytopenia Check CLL FISH and IGHV I recommend watchful waiting.  CKD (chronic kidney disease) stage 3, GFR 30-59 ml/min (HCC) Encourage oral hydration and avoid nephrotoxins.    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       Orders Placed This Encounter  Procedures   Miscellaneous [facility] test (send-out)    Standing Status:   Future    Expected Date:   01/01/2025    Expiration Date:   07/03/2025    Test name / description::   IGVH somatic hypermutation [facility] [id number]   FISH HES [id number] REA    Standing Status:   Future    Expected Date:   07/03/2024    Expiration Date:   07/03/2025   CBC with Differential (Cancer Center Only)    Standing Status:   Future    Expected Date:   01/01/2025    Expiration Date:   04/01/2025   CMP (Cancer Center only)    Standing Status:   Future    Expected Date:   01/01/2025    Expiration Date:   04/01/2025   Multiple Myeloma Panel (SPEP&IFE w/QIG)    Standing Status:   Future    Expected Date:   01/01/2025    Expiration Date:   04/01/2025   Kappa/lambda light chains    Standing Status:   Future    Expected Date:   01/01/2025    Expiration Date:   04/01/2025   Follow up in 4 months All questions were answered. The patient  knows to call the clinic with any problems, questions or concerns.  Zelphia Cap, MD, PhD [facility] Hematology Oncology 07/03/2024   HISTORY OF PRESENTING ILLNESS:   Shelly Phillips is a  89 y.o.  female with PMH listed below was seen in consultation at the request of  Alla Amis, MD  for evaluation of lymphocytosis  She has a history of elevated white blood cell count, first noted in 2017, with a total white blood cell count of 14. 10/02/2023 cbc showed wbc 16.3, predominately lymphocytosis, ALC 8.7.  as noted in recent blood work. Smear showed raising suspicion for chronic lymphocytic leukemia (CLL).  No unintentional weight loss, night sweats, or low-grade fever. She experiences fatigue and occasional weakness, which she attributes to aging. She has a variable appetite and numbness in her fingers.  INTERVAL HISTORY Shelly Phillips is a 89 y.o. female who has above history reviewed by me today presents for follow up visit for CLL, MUGS Patient denies unintentional weight loss, night sweats or fever.   Appetite is good.  MEDICAL HISTORY:  Past Medical History:  Diagnosis Date   Basal cell carcinoma 04/14/2014   nose - Mohs Dr Bluford   Basal cell carcinoma 10/19/2022   Right medial cheek, needs mohs. Patient states she did  not have Mohs surgery with Dr. Bluford. She states the area healed well and cleared up. She called Dr. Metta office and cancelled appointment.   HLD (hyperlipidemia)    Hypertension    Macular degeneration    Obesity    Pre-diabetes    Rosacea    Skin cancer    Squamous cell carcinoma of skin 05/23/2018   right midline mid dorsum nose - [facility]   Squamous cell carcinoma of skin 04/26/2023   Left Supratip of Nose, [facility]   Urinary tract infection     SURGICAL HISTORY: Past Surgical History:  Procedure Laterality Date   ABDOMINAL HYSTERECTOMY     CATARACT EXTRACTION W/PHACO Right 11/29/2016   Procedure: CATARACT EXTRACTION PHACO AND INTRAOCULAR LENS PLACEMENT  (IOC);  Surgeon: Jaye Fallow, MD;  Location: [facility] ORS;  Service: Ophthalmology;  Laterality: Right;  US  49.8 AP% 21.9 CDE 10.89 Fluid Pack Lot # [id number] H   FACIAL RECONSTRUCTION SURGERY     FRACTURE SURGERY      SOCIAL HISTORY: Social History   Socioeconomic History   Marital status: Widowed    Spouse name: Not on file   Number of children: Not on file   Years of education: Not on file   Highest education level: Not on file  Occupational History   Not on file  Tobacco Use   Smoking status: Former    Current packs/day: 0.00    Types: Cigarettes    Quit date: 1980    Years since quitting: 45.9   Smokeless tobacco: Never  Substance and Sexual Activity   Alcohol use: No   Drug use: No   Sexual activity: Not on file  Other Topics Concern   Not on file  Social History Narrative   Not on file   Social Drivers of Health   Tobacco Use: Medium Risk (07/03/2024)   Patient History    Smoking Tobacco Use: Former    Smokeless Tobacco Use: Never    Passive Exposure: Not on file  Financial Resource Strain: Low Risk  (10/02/2023)   Received from [facility]   Overall Financial Resource Strain (CARDIA)    Difficulty of Paying Living Expenses: Not hard at all  Food Insecurity: No Food Insecurity (10/20/2023)   Hunger Vital Sign    Worried About Running Out of Food in the Last Year: Never true    Ran Out of Food in the Last Year: Never true  Transportation Needs: No Transportation Needs (10/20/2023)   PRAPARE - Administrator, Civil Service (Medical): No    Lack of Transportation (Non-Medical): No  Physical Activity: Not on file  Stress: Not on file  Social Connections: Not on file  Intimate Partner Violence: Not At Risk (10/20/2023)   Humiliation, Afraid, Rape, and Kick questionnaire    Fear of Current or Ex-Partner: No    Emotionally Abused: No    Physically Abused: No    Sexually Abused: No  Depression (PHQ2-9): Low Risk (02/19/2024)    Depression (PHQ2-9)    PHQ-2 Score: 0  Alcohol Screen: Not on file  Housing: Low Risk (10/20/2023)   Housing Stability Vital Sign    Unable to Pay for Housing in the Last Year: No    Number of Times Moved in the Last Year: 0    Homeless in the Last Year: No  Utilities: Not At Risk (10/20/2023)   AHC Utilities    Threatened with loss of utilities: No  Health Literacy: Not on file  FAMILY HISTORY: Family History  Problem Relation Age of Onset   Parkinson's disease Mother    CAD Father    Cancer Brother        brain tumor    ALLERGIES:  is allergic to demerol [meperidine] and diltiazem .  MEDICATIONS:  Current Outpatient Medications  Medication Sig Dispense Refill   acetaminophen  (TYLENOL ) 500 MG tablet Take 1,000 mg by mouth at bedtime.     aspirin  EC 81 MG tablet Take 1 tablet by mouth daily.     atorvastatin  (LIPITOR) 20 MG tablet Take 20 mg by mouth daily.     metoprolol  tartrate (LOPRESSOR ) 25 MG tablet Take 1 tablet (25 mg total) by mouth 2 (two) times daily. 60 tablet 0   Multiple Vitamin (MULTI-VITAMIN) tablet Take 1 tablet by mouth daily.     Multiple Vitamins-Minerals (PRESERVISION AREDS PO) Take by mouth.     sodium chloride  (OCEAN) 0.65 % SOLN nasal spray Place 2 sprays into both nostrils 2 (two) times daily.     triamcinolone  (NASACORT  ALLERGY 24HR) 55 MCG/ACT AERO nasal inhaler Place 1 spray into the nose 2 (two) times daily.     amoxicillin -clavulanate (AUGMENTIN ) 875-125 MG tablet Take 1 tablet by mouth every 12 (twelve) hours. (Patient not taking: Reported on 07/03/2024) 14 tablet 0   sulfamethoxazole -trimethoprim  (BACTRIM  DS) 800-160 MG tablet Take 1 tablet by mouth every 12 (twelve) hours. (Patient not taking: Reported on 07/03/2024) 28 tablet 0   No current facility-administered medications for this visit.    Review of Systems  Constitutional:  Positive for fatigue. Negative for appetite change, chills, fever and unexpected weight change.  HENT:   Negative  for hearing loss and voice change.   Eyes:  Negative for eye problems.  Respiratory:  Negative for chest tightness and cough.   Cardiovascular:  Negative for chest pain.  Gastrointestinal:  Negative for abdominal distention, abdominal pain and blood in stool.  Endocrine: Negative for hot flashes.  Genitourinary:  Negative for difficulty urinating, dysuria and frequency.   Musculoskeletal:  Negative for arthralgias.  Skin:  Negative for itching and rash.  Neurological:  Positive for numbness. Negative for extremity weakness.  Hematological:  Negative for adenopathy.  Psychiatric/Behavioral:  Negative for confusion.   All other systems reviewed and are negative.  PHYSICAL EXAMINATION: ECOG PERFORMANCE STATUS: 1 - Symptomatic but completely ambulatory Vitals:   07/03/24 1053 07/03/24 1103  BP: (!) 190/94 (!) 168/88  Pulse: 66   Resp: 18   Temp: (!) 96 F (35.6 C)   SpO2: 98%    Filed Weights   07/03/24 1053  Weight: 224 lb 14.4 oz (102 kg)    Physical Exam Constitutional:      General: She is not in acute distress. HENT:     Head: Normocephalic and atraumatic.  Eyes:     General: No scleral icterus. Cardiovascular:     Rate and Rhythm: Normal rate.  Pulmonary:     Effort: Pulmonary effort is normal. No respiratory distress.     Breath sounds: Normal breath sounds.  Abdominal:     General: There is no distension.  Musculoskeletal:        General: Normal range of motion.     Cervical back: Normal range of motion and neck supple.  Lymphadenopathy:     Cervical: No cervical adenopathy.  Skin:    Findings: No rash.  Neurological:     Mental Status: She is alert and oriented to person, place, and time. Mental status is at  baseline.  Psychiatric:        Mood and Affect: Mood normal.     LABORATORY DATA:  I have reviewed the data as listed    Latest Ref Rng & Units 06/26/2024   11:13 AM 02/19/2024   10:39 AM 10/06/2023   11:56 AM  CBC  WBC 4.0 - 10.5 K/uL 12.9   14.6  15.7   Hemoglobin 12.0 - 15.0 g/dL 86.3  86.0  85.7   Hematocrit 36.0 - 46.0 % 43.0  42.4  43.9   Platelets 150 - 400 K/uL 149  142  174       Latest Ref Rng & Units 06/26/2024   11:13 AM 02/19/2024   10:39 AM 11/06/2016    4:35 PM  CMP  Glucose 70 - 99 mg/dL 894  883  851   BUN 8 - 23 mg/dL 25  21  13    Creatinine 0.44 - 1.00 mg/dL 8.84  8.88  8.74   Sodium 135 - 145 mmol/L 138  136  133   Potassium 3.5 - 5.1 mmol/L 4.3  4.3  2.9   Chloride 98 - 111 mmol/L 105  108  101   CO2 22 - 32 mmol/L 22  21  22    Calcium  8.9 - 10.3 mg/dL 9.7  9.2  8.4   Total Protein 6.5 - 8.1 g/dL 7.7  7.2  6.9   Total Bilirubin 0.0 - 1.2 mg/dL 0.6  0.9  1.3   Alkaline Phos 38 - 126 U/L 177  99  89   AST 15 - 41 U/L 24  16  15    ALT 0 - 44 U/L 41  14  13       RADIOGRAPHIC STUDIES: I have personally reviewed the radiological images as listed and agreed with the findings in the report. US  RENAL Result Date: 06/09/2024 CLINICAL DATA:  The Recurrent UTI EXAM: RENAL / URINARY TRACT ULTRASOUND COMPLETE COMPARISON:  None Available. FINDINGS: Right Kidney: Renal measurements: 10.4 x 4.9 x 5.7 cm = volume: 152 mL. Echogenicity within normal limits. No hydronephrosis visualized. Cortical thinning with lobular contours. There is a benign cyst of the superior pole which measures 15 mm (for which no dedicated imaging follow-up is recommended) . Left Kidney: Renal measurements: 11.0 x 5.1 x 5.5 cm = volume: 161 mL. Echogenicity within normal limits. No mass or hydronephrosis visualized. Cortical thinning with lobular contours. Bladder: Appears normal for degree of bladder distention. Other: None. IMPRESSION: No hydronephrosis. Electronically Signed   By: Corean Salter M.D.   On: 06/09/2024 12:58

## 2024-07-03 NOTE — Assessment & Plan Note (Signed)
 Encourage oral hydration and avoid nephrotoxins.

## 2024-07-23 NOTE — Progress Notes (Signed)
" ° °  07/26/2024 2:26 PM   Sharman JONETTA Herring 26-Oct-1932 [id number]  Cystoscopy Procedure Note:  Indication:  Chronic cystitis, possible rUTI + persistent microhematuria RBUS (06/09/24) - normal  After informed consent and discussion of the procedure and its risks, EVERETTE DIMAURO was positioned and prepped in the standard fashion. Cystoscopy was performed with a flexible cystoscope. The external vaginal anatomy, urethra, bladder neck and bladder mucosa were visualized in a systematic fashion.  Mild to moderate vaginal mucosa atrophy and thinning.  The ureteral orifices were noted in orthotopic location and orientation.  She appeared to have diffusely inflamed mucosa with localized patches along the posterior wall and towards the anterior dome.  No obvious bladder masses.  The erythematous patches were quite friable.  For this reason, cystoscopic barbotage urine cytology was collected.  Imaging: Recent RBUS reviewed - no GU abnormalities noted  Findings: Diffuse erythematous mucosa, some localized patches-more likely diffuse cystitis and inflammation--although cannot fully exclude a sessile urothelial malignancy  Assessment and Plan: -Will follow-up urine cytology.  If positive, would need to consider OR bladder biopsy.  This would need to be shared decision making with patient and weighed against the risk of anesthesia and complications, as well as implications for longer-term management at her age and comorbid status.  - start daily 1g PO BID Methenamine  for rUTI prophylaxis   - consider addition of topical estrogen in the future as well  Penne Skye, MD 07/23/2024   "

## 2024-07-26 ENCOUNTER — Ambulatory Visit (INDEPENDENT_AMBULATORY_CARE_PROVIDER_SITE_OTHER): Admitting: Urology

## 2024-07-26 ENCOUNTER — Encounter: Payer: Self-pay | Admitting: Urology

## 2024-07-26 VITALS — BP 157/94 | HR 72 | Ht 68.0 in | Wt 225.0 lb

## 2024-07-26 DIAGNOSIS — N390 Urinary tract infection, site not specified: Secondary | ICD-10-CM | POA: Diagnosis not present

## 2024-07-26 LAB — URINALYSIS, COMPLETE
Bilirubin, UA: NEGATIVE
Glucose, UA: NEGATIVE
Ketones, UA: NEGATIVE
Nitrite, UA: NEGATIVE
Specific Gravity, UA: 1.02 (ref 1.005–1.030)
Urobilinogen, Ur: 0.2 mg/dL (ref 0.2–1.0)
pH, UA: 6 (ref 5.0–7.5)

## 2024-07-26 LAB — MICROSCOPIC EXAMINATION
Epithelial Cells (non renal): >10 /HPF — AB (ref 0–10)
RBC, Urine: >30 /HPF — AB (ref 0–2)
WBC, UA: >30 /HPF — AB (ref 0–5)

## 2024-07-26 MED ORDER — SULFAMETHOXAZOLE-TRIMETHOPRIM 800-160 MG PO TABS
1.0000 | ORAL_TABLET | Freq: Once | ORAL | Status: AC
  Administered 2024-07-26: 1 via ORAL

## 2024-07-26 MED ORDER — METHENAMINE HIPPURATE 1 G PO TABS: 1.0000 g | ORAL_TABLET | Freq: Two times a day (BID) | ORAL | 3 refills | Status: AC

## 2024-07-26 NOTE — Patient Instructions (Signed)
 Follow up when we get her bladder wash cytology back and urine culture.

## 2024-07-30 ENCOUNTER — Ambulatory Visit: Payer: Self-pay | Admitting: Urology

## 2024-08-01 LAB — CULTURE, URINE COMPREHENSIVE

## 2024-08-02 LAB — "CYTOLOGY PLUS MONITORING PROFILE: PAP & FEULGEN "

## 2024-08-26 ENCOUNTER — Ambulatory Visit: Admitting: Dermatology

## 2024-12-30 ENCOUNTER — Inpatient Hospital Stay

## 2025-01-08 ENCOUNTER — Inpatient Hospital Stay: Admitting: Oncology
